# Patient Record
Sex: Male | Born: 1975 | Race: Black or African American | Hispanic: No | Marital: Married | Smoking: Current every day smoker
Health system: Southern US, Community
[De-identification: ages and names within clinical notes are randomized; demographics above are authoritative.]

## PROBLEM LIST (undated history)

## (undated) DIAGNOSIS — F32A Depression, unspecified: Secondary | ICD-10-CM

## (undated) DIAGNOSIS — F419 Anxiety disorder, unspecified: Secondary | ICD-10-CM

## (undated) DIAGNOSIS — F209 Schizophrenia, unspecified: Secondary | ICD-10-CM

## (undated) DIAGNOSIS — F4310 Post-traumatic stress disorder, unspecified: Secondary | ICD-10-CM

---

## 2011-11-18 ENCOUNTER — Emergency Department: Payer: Self-pay | Admitting: *Deleted

## 2020-07-15 ENCOUNTER — Ambulatory Visit: Payer: Self-pay | Attending: Internal Medicine

## 2020-07-15 DIAGNOSIS — Z23 Encounter for immunization: Secondary | ICD-10-CM

## 2020-07-15 NOTE — Progress Notes (Signed)
   Covid-19 Vaccination Clinic  Name:  TYWON NIDAY    MRN: [id number] DOB: 1976/11/20  07/15/2020  Mr. Brew was observed post Covid-19 immunization for 15 minutes without incident. He was provided with Vaccine Information Sheet and instruction to access the V-Safe system.   Mr. Daubenspeck was instructed to call 911 with any severe reactions post vaccine: Marland Kitchen Difficulty breathing  . Swelling of face and throat  . A fast heartbeat  . A bad rash all over body  . Dizziness and weakness   Immunizations Administered    Name Date Dose VIS Date Route   Pfizer COVID-19 Vaccine 07/15/2020  4:47 PM 0.3 mL 01/23/2019 Intramuscular   Manufacturer: [employer], [employer]   Lot: [id number]   NDC: [address]-3

## 2020-08-05 ENCOUNTER — Ambulatory Visit: Payer: Self-pay

## 2020-08-25 ENCOUNTER — Other Ambulatory Visit: Payer: Self-pay

## 2020-08-25 ENCOUNTER — Ambulatory Visit (HOSPITAL_COMMUNITY)
Admission: EM | Admit: 2020-08-25 | Discharge: 2020-08-25 | Disposition: A | Discharge status: Home / Self Care | Payer: Self-pay | Attending: Family Medicine | Admitting: Family Medicine

## 2020-08-25 ENCOUNTER — Ambulatory Visit (INDEPENDENT_AMBULATORY_CARE_PROVIDER_SITE_OTHER): Payer: Self-pay

## 2020-08-25 ENCOUNTER — Encounter (HOSPITAL_COMMUNITY): Payer: Self-pay | Admitting: Emergency Medicine

## 2020-08-25 DIAGNOSIS — R0602 Shortness of breath: Secondary | ICD-10-CM

## 2020-08-25 DIAGNOSIS — S20212A Contusion of left front wall of thorax, initial encounter: Secondary | ICD-10-CM

## 2020-08-25 DIAGNOSIS — R05 Cough: Secondary | ICD-10-CM

## 2020-08-25 DIAGNOSIS — R0781 Pleurodynia: Secondary | ICD-10-CM

## 2020-08-25 MED ORDER — HYDROCODONE-ACETAMINOPHEN 5-325 MG PO TABS: 1.0000 | ORAL_TABLET | Freq: Every evening | ORAL | 0 refills | Status: DC | PRN

## 2020-08-25 MED ORDER — IBUPROFEN 800 MG PO TABS: 800.0000 mg | ORAL_TABLET | Freq: Three times a day (TID) | ORAL | 0 refills | Status: DC

## 2020-08-25 NOTE — Discharge Instructions (Addendum)
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
## 2021-02-07 ENCOUNTER — Other Ambulatory Visit: Payer: Self-pay

## 2021-02-07 ENCOUNTER — Emergency Department (HOSPITAL_COMMUNITY): Payer: Medicaid Other

## 2021-02-07 ENCOUNTER — Emergency Department (HOSPITAL_COMMUNITY)
Admission: EM | Admit: 2021-02-07 | Discharge: 2021-02-13 | Disposition: A | Discharge status: Home / Self Care | Payer: Medicaid Other | Attending: Emergency Medicine | Admitting: Emergency Medicine

## 2021-02-07 ENCOUNTER — Encounter (HOSPITAL_COMMUNITY): Payer: Self-pay | Admitting: Emergency Medicine

## 2021-02-07 ENCOUNTER — Ambulatory Visit (HOSPITAL_COMMUNITY)
Admission: EM | Admit: 2021-02-07 | Discharge: 2021-02-07 | Disposition: A | Discharge status: Other Acute Inpatient Hospital | Payer: No Payment, Other | Attending: Psychiatry | Admitting: Psychiatry

## 2021-02-07 DIAGNOSIS — Z20822 Contact with and (suspected) exposure to covid-19: Secondary | ICD-10-CM | POA: Insufficient documentation

## 2021-02-07 DIAGNOSIS — F339 Major depressive disorder, recurrent, unspecified: Secondary | ICD-10-CM | POA: Insufficient documentation

## 2021-02-07 DIAGNOSIS — F1999 Other psychoactive substance use, unspecified with unspecified psychoactive substance-induced disorder: Secondary | ICD-10-CM

## 2021-02-07 DIAGNOSIS — R45850 Homicidal ideations: Secondary | ICD-10-CM

## 2021-02-07 DIAGNOSIS — F1990 Other psychoactive substance use, unspecified, uncomplicated: Secondary | ICD-10-CM | POA: Insufficient documentation

## 2021-02-07 DIAGNOSIS — F209 Schizophrenia, unspecified: Secondary | ICD-10-CM | POA: Diagnosis present

## 2021-02-07 DIAGNOSIS — F17200 Nicotine dependence, unspecified, uncomplicated: Secondary | ICD-10-CM | POA: Insufficient documentation

## 2021-02-07 DIAGNOSIS — R45851 Suicidal ideations: Secondary | ICD-10-CM | POA: Insufficient documentation

## 2021-02-07 HISTORY — DX: Schizophrenia, unspecified: F20.9

## 2021-02-07 LAB — URINALYSIS, ROUTINE W REFLEX MICROSCOPIC
Bilirubin Urine: NEGATIVE
Glucose, UA: NEGATIVE mg/dL
Hgb urine dipstick: NEGATIVE
Ketones, ur: NEGATIVE mg/dL
Leukocytes,Ua: NEGATIVE
Nitrite: NEGATIVE
Protein, ur: NEGATIVE mg/dL
Specific Gravity, Urine: 1.025 (ref 1.005–1.030)
pH: 5 (ref 5.0–8.0)

## 2021-02-07 LAB — COMPREHENSIVE METABOLIC PANEL
ALT: 24 U/L (ref 0–44)
AST: 22 U/L (ref 15–41)
Albumin: 3.7 g/dL (ref 3.5–5.0)
Alkaline Phosphatase: 64 U/L (ref 38–126)
Anion gap: 8 (ref 5–15)
BUN: 8 mg/dL (ref 6–20)
CO2: 28 mmol/L (ref 22–32)
Calcium: 8.7 mg/dL — ABNORMAL LOW (ref 8.9–10.3)
Chloride: 103 mmol/L (ref 98–111)
Creatinine, Ser: 0.97 mg/dL (ref 0.61–1.24)
GFR, Estimated: >60 mL/min (ref >60)
Glucose, Bld: 87 mg/dL (ref 70–99)
Potassium: 3.7 mmol/L (ref 3.5–5.1)
Sodium: 139 mmol/L (ref 135–145)
Total Bilirubin: 0.9 mg/dL (ref 0.3–1.2)
Total Protein: 6.6 g/dL (ref 6.5–8.1)

## 2021-02-07 LAB — RAPID URINE DRUG SCREEN, HOSP PERFORMED
Amphetamines: NOT DETECTED
Barbiturates: NOT DETECTED
Benzodiazepines: POSITIVE — AB
Cocaine: NOT DETECTED
Opiates: NOT DETECTED
Tetrahydrocannabinol: POSITIVE — AB

## 2021-02-07 LAB — RESP PANEL BY RT-PCR (FLU A&B, COVID) ARPGX2
Influenza A by PCR: NEGATIVE
Influenza B by PCR: NEGATIVE
SARS Coronavirus 2 by RT PCR: NEGATIVE

## 2021-02-07 LAB — CBC
HCT: 47.4 % (ref 39.0–52.0)
Hemoglobin: 16.3 g/dL (ref 13.0–17.0)
MCH: 33.7 pg (ref 26.0–34.0)
MCHC: 34.4 g/dL (ref 30.0–36.0)
MCV: 97.9 fL (ref 80.0–100.0)
Platelets: 228 10*3/uL (ref 150–400)
RBC: 4.84 MIL/uL (ref 4.22–5.81)
RDW: 13.2 % (ref 11.5–15.5)
WBC: 18.3 10*3/uL — ABNORMAL HIGH (ref 4.0–10.5)
nRBC: 0 % (ref 0.0–0.2)

## 2021-02-07 LAB — SALICYLATE LEVEL: Salicylate Lvl: <7 mg/dL — ABNORMAL LOW (ref 7.0–30.0)

## 2021-02-07 LAB — ACETAMINOPHEN LEVEL: Acetaminophen (Tylenol), Serum: <10 ug/mL — ABNORMAL LOW (ref 10–30)

## 2021-02-07 LAB — ETHANOL: Alcohol, Ethyl (B): <10 mg/dL (ref <10)

## 2021-02-07 NOTE — ED Notes (Signed)
Belongings bagged, labeled, and placed in locker #5.  Security wanded pt and belongings.
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
## 2021-02-07 NOTE — BH Assessment (Signed)
Comprehensive Clinical Assessment (CCA) Note  02/07/2021 Justin Neal [id number]  Chief Complaint:  Chief Complaint  Patient presents with  . Medical Clearance   Visit Diagnosis:   F33.9 Major depressive disorder, Recurrent episode, Unspecified   The patient demonstrates the following risk factors for suicide: Chronic risk factors for suicide include: psychiatric disorder of major depressive disorder, no suicide or previous attempts today and 45 years old, pt reports HI towards previous relationships, current substance used . Acute risk factors for suicide include: family or marital conflict, unemployment and social withdrawal/isolation. Protective factors for this patient include: positive social support, positive therapeutic relationship and coping skills. Considering these factors, the overall suicide risk at this point appears to be.  appropriate for outpatient follow up.    Flowsheet Row ED from 02/07/2021 in [facility] EMERGENCY DEPARTMENT  C-SSRS RISK CATEGORY No Risk     Therefore, no sitter for suicide is recommended.  Disposition: [name] NP, patient meets inpatient criteria,  morning AC to evaluate bed availability.  Disposition was discussed with [name], RN via secure chat in [place].  RN to discuss disposition with EDP.   Justin Neal is a 45 years old single male who presents involuntarily to [employer] Ed via Patent examiner and unaccompanied.  Pt sister Justin Neal [phone] , participated in his assessment at Pt's request. Pt IVC reads " Pt presents to the ED from [facility] with homicidal ideation.  He has a history of schizophrenia, not on medications currently and also has assault charges against him.  Pt found to be impulsive with volatile behavior at [facility] today.  He is currently a threat to other people and flight risk. Pt denies suicide and prior suicide attempt.  Pt acknowledges symptoms including crying, social withdrawal, angry,  argumentative, blames others, irritable, and easily annoyed. Pt reports decreased sleep, decreased appetite  And feeling hopelessness. Pt reports "they are following me, hacking my phone and harassing me, I am going to get them".  Pt sister  Justin Neal reports "my brother thinks the government and police are following him; also he refused to take his medicaton, he said it made him sick and zonk out".    Pt denies any history of intentional self injurious behaviors.  Pt denies any history of auditory or visual hallucination.  Pt denies paranoia.  Pt reports that he smokes marijuana; also reported that he was prescribed medically marijuana in [place].  Pt denies any other substance use.  Pt identifies his primary stressor as being homeless and unemployed "people trying to take me under".  Pt reports that he lives in his car and various spots in the community.  Pt reports that previous relationships are trying to set him up to return back to jail. Pt reports both paternal and maternal family history deal with mental health and subtance use.  Pt denies any history of abuse or trauma.  Pt reports a court date scheduled for February 13, 2021.    Pt says he is currently not receiving weekly outpatient therapy; also reports that he is not taking medication management.  Pt reports that he was prescribed trazodone and risperidone by [facility], currently is not taking it.  Pt reports no prior inpatient psychiatric hospitalization.  Pt reports that he has a diagnosis of injury collarbone.    Pt is dressed in scrubs, alert, oriented X 4 with loud speech and restless motor behavior.  Eye contact is good and pt mood negative and affect is anxious.  Thought process is coherent and relevant.  Pt' s insight is good and judgement is fair.  There is no indication Pt is currently responding to internal stimuli throughout assessment.     CCA Screening, Triage and Referral (STR)  Patient Reported Information How did you hear  about [place]? Self  Referral name: No data recorded Referral phone number: No data recorded  Whom do you see for routine medical problems? No data recorded Practice/Facility Name: No data recorded Practice/Facility Phone Number: No data recorded Name of Contact: No data recorded Contact Number: No data recorded Contact Fax Number: No data recorded Prescriber Name: No data recorded Prescriber Address (if known): No data recorded  What Is the Reason for Your Visit/Call Today? Patient states that he is off his medication and feels like he is decompensating.  He is hyperverbal and somewhat manic.  Homicidal thoughts towards others that he thinks is messing with him.  How Long Has This Been Causing You Problems? > than 6 months  What Do You Feel Would Help You the Most Today? Treatment for Depression or other mood problem; Alcohol or Drug Use Treatment; Housing Assistance; Financial Resources; Food Assistance; Social Support; Tobacco/Nicotine Dependence; Medication(s)   Have You Recently Been in Any Inpatient Treatment (Hospital/Detox/Crisis Center/28-Day Program)? No data recorded Name/Location of Program/Hospital:No data recorded How Long Were You There? No data recorded When Were You Discharged? No data recorded  Have You Ever Received Services From [facility] Before? No data recorded Who Do You See at [facility]? No data recorded  Have You Recently Had Any Thoughts About Hurting Yourself? No  Are You Planning to Commit Suicide/Harm Yourself At This time? No   Have you Recently Had Thoughts About Hurting Someone Justin Neal? Yes  Explanation: No data recorded  Have You Used Any Alcohol or Drugs in the Past 24 Hours? Yes  How Long Ago Did You Use Drugs or Alcohol? No data recorded What Did You Use and How Much? unable to assess   Do You Currently Have a Therapist/Psychiatrist? No data recorded Name of Therapist/Psychiatrist: No data recorded  Have You Been Recently Discharged From  Any Office Practice or Programs? No data recorded Explanation of Discharge From Practice/Program: No data recorded    CCA Screening Triage Referral Assessment Type of Contact: No data recorded Is this Initial or Reassessment? No data recorded Date Telepsych consult ordered in [facility]:  No data recorded Time Telepsych consult ordered in [facility]:  No data recorded  Patient Reported Information Reviewed? No data recorded Patient Left Without Being Seen? No data recorded Reason for Not Completing Assessment: No data recorded  Collateral Involvement: No data recorded  Does Patient Have a Court Appointed Legal Guardian? No data recorded Name and Contact of Legal Guardian: No data recorded If Minor and Not Living with Parent(s), Who has Custody? No data recorded Is CPS involved or ever been involved? No data recorded Is APS involved or ever been involved? No data recorded  Patient Determined To Be At Risk for Harm To Self or Others Based on Review of Patient Reported Information or Presenting Complaint? No data recorded Method: No data recorded Availability of Means: No data recorded Intent: No data recorded Notification Required: No data recorded Additional Information for Danger to Others Potential: No data recorded Additional Comments for Danger to Others Potential: No data recorded Are There Guns or Other Weapons in Your Home? No data recorded Types of Guns/Weapons: No data recorded Are These Weapons Safely Secured?  No data recorded Who Could Verify You Are Able To Have These Secured: No data recorded Do You Have any Outstanding Charges, Pending Court Dates, Parole/Probation? No data recorded Contacted To Inform of Risk of Harm To Self or Others: No data recorded  Location of Assessment: No data recorded  Does Patient Present under Involuntary Commitment? No data recorded IVC Papers Initial File Date: No data recorded  [place] of Residence: No data  recorded  Patient Currently Receiving the Following Services: No data recorded  Determination of Need: Emergent (2 hours)   Options For Referral: Medication Management; Intensive Outpatient Therapy; Inpatient Hospitalization; Partial Hospitalization; Other: Comment (ED for medical clearance)     CCA Biopsychosocial Intake/Chief Complaint:  Paranoia  Current Symptoms/Problems: aggressive, angry, impulsive, irritable   Patient Reported Schizophrenia/Schizoaffective Diagnosis in Past: Yes   Strengths: [name]  Preferences: [name]  Abilities: [name]   Type of Services Patient Feels are Needed: [name]   Initial Clinical Notes/Concerns: Impulsive   Mental Health Symptoms Depression:  Difficulty Concentrating; Fatigue; Hopelessness; Irritability; Tearfulness; Worthlessness   Duration of Depressive symptoms: Greater than two weeks   Mania:  Irritability; Racing thoughts; Recklessness   Anxiety:   Restlessness; Irritability; Fatigue; Difficulty concentrating; Worrying; Tension   Psychosis:  None   Duration of Psychotic symptoms: Less than six months   Trauma:  None   Obsessions:  None   Compulsions:  Disrupts with routine/functioning   Inattention:  None   Hyperactivity/Impulsivity:  Always on the go; Blurts out answers; Feeling of restlessness; Fidgets with hands/feet   Oppositional/Defiant Behaviors:  Angry; Argumentative; Easily annoyed; Aggression towards people/animals   Emotional Irregularity:  No data recorded  Other Mood/Personality Symptoms:  easily annoyed    Mental Status Exam Appearance and self-care  Stature:  Average   Weight:  Average weight   Clothing:  -- (Pt dressed in scrubs)   Grooming:  Normal   Cosmetic use:  None   Posture/gait:  Normal   Motor activity:  Agitated   Sensorium  Attention:  No data recorded  Concentration:  Scattered   Orientation:  Object; Person; Situation; Place   Recall/memory:  Normal   Affect and Mood   Affect:  Anxious   Mood:  Irritable; Negative   Relating  Eye contact:  Normal   Facial expression:  Tense; Angry   Attitude toward examiner:  Cooperative   Thought and Language  Speech flow: Loud; Pressured   Thought content:  Personalizations   Preoccupation:  None   Hallucinations:  None   Organization:  No data recorded  [employer] of Knowledge:  Fair   Intelligence:  Below average   Abstraction:  Overly abstract   Judgement:  Fair   Reality Testing:  Distorted   Insight:  Poor; Gaps   Decision Making:  Impulsive   Social Functioning  Social Maturity:  Impulsive   Social Judgement:  "Street Smart"   Stress  Stressors:  Housing; Relationship; Transitions   Coping Ability:  Human resources officer Deficits:  Decision making; Responsibility; Self-care; Self-control   Supports:  Family; Support needed     Religion: Religion/Spirituality Are You A Religious Person?: Yes What is Your Religious Affiliation?: Primitive Baptist How Might This Affect Treatment?: support  Leisure/Recreation: Leisure / Recreation Do You Have Hobbies?:  ([name])  Exercise/Diet: Exercise/Diet Do You Exercise?: Yes What Type of Exercise Do You Do?: Run/Walk How Many Times a Week Do You Exercise?: 1-3 times a week Have You Gained or Lost A Significant Amount  of Weight in the Past Six Months?: No Do You Follow a Special Diet?: No Do You Have Any Trouble Sleeping?: Yes Explanation of Sleeping Difficulties: Pt reports that he is homeless, unable to locate a safe place to sleep at night   CCA Employment/Education Employment/Work Situation: Employment / Work Situation Employment situation: Unemployed Patient's job has been impacted by current illness: Yes Describe how patient's job has been impacted: Pt reports he unable to find work, only day work What is the longest time patient has a held a job?: [name] Where was the patient employed at that time?: [name] Has  patient ever been in the [employer] and Companymilitary?: No  Education: Education Is Patient Currently Attending School?: No Last Grade Completed:  (Pt reported that he did not graduate from high school, received a GED) Name of High School: Pt reported that he did not graduate from high school, received a GED Did Garment/textile technologistYou Graduate From [employer]High School?: No Did Theme park managerYou Attend College?: No Did You Attend Graduate School?: No Did You Have Any Special Interests In School?: [name] Did You Have An Individualized Education Program (IIEP):  ([name]) Did You Have Any Difficulty At Progress EnergySchool?:  ([name]) Patient's Education Has Been Impacted by Current Illness:  ([name])   CCA Family/Childhood History Family and Relationship History: Family history Are you sexually active?:  ([name]) What is your sexual orientation?: [name] Has your sexual activity been affected by drugs, alcohol, medication, or emotional stress?: [name] Does patient have children?: Yes How many children?: 1 How is patient's relationship with their children?: [name]  Childhood History:     Child/Adolescent Assessment:     CCA Substance Use Alcohol/Drug Use: Alcohol / Drug Use Pain Medications: See MRA Prescriptions: See MRA Over the Counter: See MRA History of alcohol / drug use?: Yes Substance #1 Name of Substance 1: Marijuana 1 - Age of First Use: 12 1 - Amount (size/oz): [name] 1 - Frequency: [name] 1 - Duration: [name] 1 - Last Use / Amount: [name] 1 - Method of Aquiring: [name] 1- Route of Use: [name] Substance #2 Name of Substance 2: Cigerettes 2 - Age of First Use: [name] 2 - Amount (size/oz): [name] 2 - Frequency: [name] 2 - Duration: [name] 2 - Last Use / Amount: [name] 2 - Method of Aquiring: [name] 2 - Route of Substance Use: [name]                     ASAM's:  Six Dimensions of Multidimensional Assessment  Dimension 1:  Acute Intoxication and/or Withdrawal Potential:   Dimension 1:  Description of individual's past and current experiences of substance use and withdrawal: Pt  reports that he smokes marijuana for anxiety; also, reported that it was medically prescribed by physican in [place]  Dimension 2:  Biomedical Conditions and Complications:   Dimension 2:  Description of patient's biomedical conditions and  complications: Pt reports prior injuries to collarbone.  Dimension 3:  Emotional, Behavioral, or Cognitive Conditions and Complications:  Dimension 3:  Description of emotional, behavioral, or cognitive conditions and complications: Pt reports depression, anxiety, sschizophrenic, PTSD  Dimension 4:  Readiness to Change:  Dimension 4:  Description of Readiness to Change criteria: Pt reports that he is tired of being homeless  Dimension 5:  Relapse, Continued use, or Continued Problem Potential:  Dimension 5:  Relapse, continued use, or continued problem potential critiera description: contemplation  Dimension 6:  Recovery/Living Environment:  Dimension 6:  Recovery/Iiving environment criteria description: homeless  ASAM Severity Score: ASAM's Severity Rating  Score: 14  ASAM Recommended Level of Treatment:     Substance use Disorder (SUD) Substance Use Disorder (SUD)  Checklist Symptoms of Substance Use: Continued use despite having a persistent/recurrent physical/psychological problem caused/exacerbated by use,Continued use despite persistent or recurrent social, interpersonal problems, caused or exacerbated by use  Recommendations for Services/Supports/Treatments: Recommendations for Services/Supports/Treatments Recommendations For Services/Supports/Treatments: SAIOP (Substance Abuse Intensive Outpatient Program)  DSM5 Diagnoses: There are no problems to display for this patient.     Referrals to Alternative Service(s): Referred to Alternative Service(s):   Place:   Date:   Time:    Referred to Alternative Service(s):   Place:   Date:   Time:    Referred to Alternative Service(s):   Place:   Date:   Time:    Referred to Alternative Service(s):   Place:    Date:   Time:     Meryle Ready, Counselor

## 2021-02-07 NOTE — Progress Notes (Signed)
Patient presented to the [facility] as a walk-in. His speech was pressured, he was tangential, hyper-verbal and appeared to be potentially labile.  Patient states that he moved back to [place] from [place] approximately one year ago.  He states that he has been diagnosed with schizophrenia, bipolar disorder, depression and schizophrenia and states that he has not been on his medication in the past year. Patient states that he has been in and out of prison and states that he was involved with a male that he thinks is trying to get him back in the system.  Patient presently as assault charges, assault with a deadly weapon as well as some traffic infractions and is scheduled to be in court on 4/15.  Patient denies SI, but states that if he continues to be provoked that he is angry enough that he could kill the people who are provoking him and trying to get him into trouble. He denies any previous attempts to try to hurt himself. He states that he has no plan or intent to hurt anyone at this time.Patient admits to daily marijuana use and states that is the only thing that has helped him to maintain his composure.  Patient states that he is currently homeless and has no support.  Patient states, "I cannot continue to live this way."  Patient states that he des not know where to go to get the help that he needs and feels like he needs to be admitted to the hospital, get back on his medications and to get some assistance with community based resources that he can utilize to continue to maintain his life and not have to be on the streets.  However, if he is convicted of his current charges, he will most likely be incarcerated.  Patient states that he has not been sleeping or eating well. Patient was seen by Maxie Barb, NP who felt like patient needs an inpatient admission due to his impulsivity and volatile behavior and the fact that he is communicating homicidal thoughts. Arrangements were made to transfer patient to  [facility].  This Clinical research associate spoke to [employer], Consulting civil engineer to notify of the transfer.  Non-emergent EMS to transport.

## 2021-02-07 NOTE — ED Provider Notes (Signed)
Behavioral Health Admission H&P [facility] & OBS)  Date: 02/07/21 Patient Name: Justin Neal MRN: [id number] Chief Complaint:  Chief Complaint  Patient presents with  . Urgent Emergent Eval     Diagnoses:  Final diagnoses:  Substance-induced disorder (HCC)   HPI: Justin Neal is a 45 year old male who presents to the [facility] voluntarily for assessment; patient states he "needs to get back in the system for some help".   On assessment patient presents labile, hyperverbal, tangential, illogical, and paranoid. Patient mentioning people "after him" and possibly having to "kill somebody". While provider was asking questions patient noted to increase in aggression and making several threats. Patient endorses to smoking marijuana "this morning"; denies that his current presentation is as a result. Patient admits to an extensive legal history and psychiatric history of anxiety, depression, bipolar, and schizophrenia. Patient states he was recently in jail where he "snapped because they did not give me my medications". Patient does have several pending charges.  Due to escalating aggressive presentation and level of paranoia patient to be transferred to local ED for further observation, stabilization, and treatment to determine if presentation is substance induce or related to underlying psychiatric diagnosis. Patient is currently declining any collateral contact information. He has agreed to plan and is currently awaiting transportation.   PHQ 2-9:     Total Time spent with patient: 15 minutes  Musculoskeletal  Strength & Muscle Tone: increased Gait & Station: normal Patient leans: N/A  Psychiatric Specialty Exam  Presentation General Appearance: Disheveled  Eye Contact:Fair  Speech:Clear and Coherent; Pressured  Speech Volume:Normal  Handedness:No data recorded  Mood and Affect  Mood:Irritable; Dysphoric  Affect:Blunt   Thought Process  Thought Processes:Disorganized  Descriptions  of Associations:Tangential  Orientation:Full (Time, Place and Person)  Thought Content:Paranoid Ideation; Tangential   Hallucinations:Hallucinations: Auditory Description of Auditory Hallucinations: "people after me"  Ideas of Reference:Paranoia; Percusatory  Suicidal Thoughts:Suicidal Thoughts: No  Homicidal Thoughts:Homicidal Thoughts: Yes, Active HI Active Intent and/or Plan: With Intent; Without Plan   Sensorium  Memory:Immediate Fair; Recent Fair; Remote Fair  Judgment:Impaired (possibly secondary to substance; pt endorses smoking weed earlier in day)  Insight:Lacking   Executive Functions  Concentration:Fair  Attention Span:Fair  Recall:Fair  Fund of Knowledge:Fair  Language:Fair   Psychomotor Activity  Psychomotor Activity:Psychomotor Activity: Increased   Assets  Assets:Communication Skills; Physical Health; Resilience   Sleep  Sleep:Sleep: Fair   No data recorded  Physical Exam Vitals and nursing note reviewed.  Psychiatric:        Attention and Perception: He is inattentive.        Mood and Affect: Affect is labile and blunt.        Speech: Speech is rapid and pressured and tangential.        Behavior: Behavior is agitated and aggressive.        Thought Content: Thought content is paranoid. Thought content includes homicidal ideation.        Judgment: Judgment is impulsive and inappropriate.    Review of Systems  Psychiatric/Behavioral: Positive for substance abuse.  All other systems reviewed and are negative.   Blood pressure 120/79, pulse 62, temperature 98.6 F (37 C), temperature source Oral, resp. rate 16, height 5\' 11"  (1.803 m), weight 210 lb (95.3 kg), SpO2 100 %. Body mass index is 29.29 kg/m.  Past Psychiatric History:  Patient reports:   -anxiety  -depression  -bipolar  -schizophrenia   Is the patient at risk to self? unable to fully determine  Has the patient been a risk to self in the past 6 months? patient denies.     Has the patient been a risk to self within the distant past? patient denies  Is the patient a risk to others? Yes   Has the patient been a risk to others in the past 6 months? Yes   Has the patient been a risk to others within the distant past? Yes   Past Medical History: No past medical history on file. No past surgical history on file.  Family History: No family history on file.  Social History:  Social History   Socioeconomic History  . Marital status: Married    Spouse name: Not on file  . Number of children: Not on file  . Years of education: Not on file  . Highest education level: Not on file  Occupational History  . Not on file  Tobacco Use  . Smoking status: Current Every Day Smoker  . Smokeless tobacco: Not on file  Substance and Sexual Activity  . Alcohol use: Not on file  . Drug use: Not on file  . Sexual activity: Not on file  Other Topics Concern  . Not on file  Social History Narrative  . Not on file   Social Determinants of Health   Financial Resource Strain: Not on file  Food Insecurity: Not on file  Transportation Needs: Not on file  Physical Activity: Not on file  Stress: Not on file  Social Connections: Not on file  Intimate Partner Violence: Not on file    SDOH:  SDOH Screenings   Alcohol Screen: Not on file  Depression (FMB3-4): Not on file  Financial Resource Strain: Not on file  Food Insecurity: Not on file  Housing: Not on file  Physical Activity: Not on file  Social Connections: Not on file  Stress: Not on file  Tobacco Use: High Risk  . Smoking Tobacco Use: Current Every Day Smoker  . Smokeless Tobacco Use: Unknown  Transportation Needs: Not on file    Last Labs:  No visits with results within 6 Month(s) from this visit.  Latest known visit with results is:  No results found for any previous visit.    Allergies: Patient has no allergy information on record.  PTA Medications: (Not in a hospital admission)   Medical  Decision Making  Patient to transfer to local ED for further observation, stabilization, and treatment.     Recommendations  Patient being transferred to ED for further observation, safety, and stabilization to rule out any possible underlying causes.   Loletta Parish, NP 02/07/21  2:32 PM

## 2021-02-08 MED ORDER — GABAPENTIN 100 MG PO CAPS
200.0000 mg | ORAL_CAPSULE | Freq: Two times a day (BID) | ORAL | Status: DC
  Administered 2021-02-08 – 2021-02-13 (×10): 200 mg via ORAL
  Filled 2021-02-08 (×11): qty 2

## 2021-02-08 MED ORDER — RISPERIDONE 1 MG PO TABS
1.0000 mg | ORAL_TABLET | Freq: Two times a day (BID) | ORAL | Status: DC
  Administered 2021-02-08: 1 mg via ORAL
  Filled 2021-02-08 (×7): qty 1

## 2021-02-08 MED ORDER — TRAZODONE HCL 50 MG PO TABS
50.0000 mg | ORAL_TABLET | Freq: Every day | ORAL | Status: DC
  Filled 2021-02-08 (×4): qty 1

## 2021-02-08 NOTE — ED Notes (Signed)
Pt given toiletries and new scrubs/socks for a shower. Pt was not offered a shower in the morning

## 2021-02-08 NOTE — Progress Notes (Signed)
Per [name] NP, patient meets criteria for inpatient treatment. There are no available beds at [facility]. CSW faxed referrals to the following facilities for review:  [facility]  TTS will continue to seek bed placement.   Trula Slade, MSW, LCSW Clinical Social Worker 02/08/2021 10:52 AM

## 2021-02-08 NOTE — ED Notes (Signed)
Pt psych hold, vitals q8.

## 2021-02-08 NOTE — ED Provider Notes (Signed)
Emergency Medicine Observation Re-evaluation Note  Justin Neal is a 45 y.o. male, seen on rounds today.  Pt initially presented to the ED for complaints of Medical Clearance Currently, the patient is waiting in the ED for an inpatient psychiatric treatment bed.  Physical Exam  BP (!) 135/93 (BP Location: Left Arm)   Pulse (!) 51   Temp 98.2 F (36.8 C)   Resp 17   Ht 1.803 m (5\' 11" )   Wt 95.3 kg   SpO2 96%   BMI 29.30 kg/m  Physical Exam General: Calm cooperative Cardiac: Regular rate Lungs: Breathing easily Psych: Depressed mood  ED Course / MDM  EKG:  Clinical Course as of 02/08/21 1351  Sat Feb 07, 2021  1901 Pt is medically cleared. Pending TTS eval [MV]    Clinical Course User Index [MV] Feb 09, 2021, PA-C   I have reviewed the labs performed to date as well as medications administered while in observation.  Recent changes in the last 24 hours include assessment by psychiatry.  Plan  Current plan is for inpatient treatment.    Tanda Rockers, MD 02/08/21 1352

## 2021-02-08 NOTE — ED Notes (Signed)
Explained the plan of care to patient. Patient understanding. Patient endorses no needs at this time.

## 2021-02-09 NOTE — ED Notes (Signed)
Pt asked to talk to this tech. Pt stated they felt uncomfortable by the way that the pharmacy tech spoke to him. Stating "Was he trying to prescribe me new meds? Or what? I don't want the wrong meds, it felt like he was pressuring me to tell him my medicine so he can prescribe me the wrong meds".  Reassured pt that the pharmacy tech was not doing so, but simply needed to update his medication list. Pt then visibly became calm. He then thanked for the misunderstanding. Will continue to monitor pt.

## 2021-02-09 NOTE — ED Notes (Signed)
Patient unsatisfied with meal tray, kitchen called, to send replacement tray w/o red sauce.

## 2021-02-09 NOTE — Progress Notes (Signed)
Patient information has been sent to [facility] via secure chat to review for potential admission. Patient meets inpatient criteria per [name], NP.     Situation ongoing, CSW will continue to monitor progress.        Signed:  Wyvonnia Lora, LCSWA  02/09/2021 9:58 AM

## 2021-02-09 NOTE — ED Notes (Signed)
Pt.s breakfast has arrived. Pt sitting up and eating his breakfast. Will continue to monitor pt.  

## 2021-02-09 NOTE — Progress Notes (Signed)
Per [facility], facility at capacity due to staffing issues. CSW referred patient out at this time.   Pt. meets criteria for inpatient treatment per [name], NP. Referred out to the following hospitals:    Destination  Service Provider Address Phone [facility]  [address] [place] [place] [address] [phone]  [facility]  [address] [place] [address] [phone]  [facility]  1 medical [place] [address] [phone]  [facility]  [address], [place] [address] [phone]  [facility]  [address] [place], [place] [address] [phone]  [facility]  [address] [place] [address] [phone]  [facility]  [address], [place] [address] [phone]  [facility]  [address], [place] [address] [phone]  [facility]  [address] [place] [address] [phone]  [facility]  [address] [place] [address] [phone]  [facility]  [address] [place] [address] [phone]  [facility]  [address], [place] [address] [phone]  [facility]  [address], [place] [address] [phone]  [facility]  [address] [place] [address] [phone]      Disposition CSW will continue to follow for placement    Signed:  Corky Crafts, MSW, [place], LCASA 02/09/2021 2:08 PM

## 2021-02-10 DIAGNOSIS — R45850 Homicidal ideations: Secondary | ICD-10-CM

## 2021-02-10 DIAGNOSIS — F209 Schizophrenia, unspecified: Secondary | ICD-10-CM | POA: Diagnosis present

## 2021-02-10 MED ORDER — OLANZAPINE 5 MG PO TBDP
5.0000 mg | ORAL_TABLET | Freq: Every day | ORAL | Status: DC
  Administered 2021-02-10 – 2021-02-13 (×4): 5 mg via ORAL
  Filled 2021-02-10 (×4): qty 1

## 2021-02-10 NOTE — ED Notes (Signed)
Pt is becoming very upset that he was going to be moved to a different room. Pt is cussing at staff because he not wanting to keep moving rooms every single day. He wants to stay in a room where he can have "peace".

## 2021-02-10 NOTE — Consult Note (Signed)
Telepsych Consultation   Reason for Consult:  HI, IVC Referring Physician:  Tanda Rockers, MD Location of Patient:  [facility] [phone] Location of Provider: Behavioral Health TTS Department  Patient Identification: Justin Neal MRN:  [id number] Principal Diagnosis: Homicidal ideations Diagnosis:  Principal Problem:   Homicidal ideations Active Problems:   Schizophrenia (HCC)   Total Time spent with patient: 30 minutes  Subjective:   Justin Neal is a 45 y.o. male patient admitted with homicidal ideations and paranoia.  On assessment patient presents alert and oriented, initially calm and cooperative.  "It's a combination of a few things. Dealing with issues within society. Not getting my medications because of press and pressure. I'm doing my best to stay away from everybody. That's why I came in here, I'm being stalked and harassed by different people in society. They keep coming after me and people are laughing at me because it's driving me crazy. Some people are street people some people are working with the police. I do nothing, I don't sell nothing so I became a target. They keep sending me texts, they keep trying to extort people they apply pressure from both sides. No body wants to admit it. I'm tired of playing with them".   Patient continued to speak on tangentially about paranoid ideas of people being after him and receiving messages from radios and via text of threats and people "laughing at him". Patient voices having intent and means to kill "him", wouldn't provide any specifics stating the person sent him text messages during the morning making threats. Patient refused a.m. Risperdal. When asked about it patient became tangential stating, "It makes you grow titties. You don't just put someone on medications and not thinking about the consequences or side effects". Provider discussed making medication adjustment and mentioned Zyprexa as an alternative, patient states he is willing to try  medication, states "I want to get this problem fixed".   Patient continues to endorse paranoia, ideas of reference, and homicidal ideations. He denies any suicidal ideations, auditory or visual hallucinations, and does not appear to be responding to any external/internal stimuli at this time. Patient medications adjusted: Risperdal dc'd and started on 5 mg Zyprexa. Patient continues to meet inpatient criteria at this time.   HPI:   Justin Neal is a 45 year old male who initially presented to [facility] for assessment "to get back in the system", due to patient's acute presentation he was then transferred to [facility] for further observation, stabilization, and treatment. Patient reports past psychiatric history of schizophrenia; not currently on any medications. Last time in any outpatient treatment not clear due to patient's current state. Per chart review, patient was seen by [facility] inpatient in 2011; no diagnosis noted.   Past Psychiatric History:   -schizophrenia (patient reported)  Risk to Self:  pt denies Risk to Others:  yes Prior Inpatient Therapy:  yes Prior Outpatient Therapy:  yes  Past Medical History:  Past Medical History:  Diagnosis Date  . Schizophrenia (HCC)    History reviewed. No pertinent surgical history. Family History: No family history on file. Family Psychiatric  History:  Social History:  Social History   Substance and Sexual Activity  Alcohol Use Not Currently     Social History   Substance and Sexual Activity  Drug Use Yes  . Types: Marijuana    Social History   Socioeconomic History  . Marital status: Married    Spouse name: Not on file  . Number of children:  Not on file  . Years of education: Not on file  . Highest education level: Not on file  Occupational History  . Not on file  Tobacco Use  . Smoking status: Current Every Day Smoker  . Smokeless tobacco: Never Used  Substance and Sexual Activity  . Alcohol use: Not Currently  . Drug use:  Yes    Types: Marijuana  . Sexual activity: Not on file  Other Topics Concern  . Not on file  Social History Narrative  . Not on file   Social Determinants of Health   Financial Resource Strain: Not on file  Food Insecurity: Not on file  Transportation Needs: Not on file  Physical Activity: Not on file  Stress: Not on file  Social Connections: Not on file   Additional Social History:   Allergies:  No Known Allergies  Labs: No results found for this or any previous visit (from the past 48 hour(s)).  Medications:  Current Facility-Administered Medications  Medication Dose Route Frequency Provider Last Rate Last Admin  . gabapentin (NEURONTIN) capsule 200 mg  200 mg Oral BID Ophelia Shoulder E, NP   200 mg at 02/10/21 0945  . OLANZapine zydis (ZYPREXA) disintegrating tablet 5 mg  5 mg Oral Daily Leevy-Johnson, Niccolas Loeper A, NP      . traZODone (DESYREL) tablet 50 mg  50 mg Oral QHS Chales Abrahams, NP       No current outpatient medications on file.    Musculoskeletal: Strength & Muscle Tone: within normal limits Gait & Station: normal Patient leans: N/A  Psychiatric Specialty Exam: Physical Exam Psychiatric:        Attention and Perception: Attention normal.        Mood and Affect: Affect is labile.        Speech: Speech is rapid and pressured and tangential.        Behavior: Behavior is aggressive. Behavior is cooperative.        Thought Content: Thought content is paranoid. Thought content includes homicidal ideation. Thought content includes homicidal plan.        Judgment: Judgment is impulsive and inappropriate.     Review of Systems  Psychiatric/Behavioral: Positive for agitation, dysphoric mood and hallucinations.  All other systems reviewed and are negative.   Blood pressure 125/81, pulse 60, temperature 98.3 F (36.8 C), temperature source Oral, resp. rate 18, height 5\' 11"  (1.803 m), weight 95.3 kg, SpO2 98 %.Body mass index is 29.3 kg/m.  General Appearance:  Casual  Eye Contact:  Fair  Speech:  Pressured  Volume:  Increased  Mood:  labile  Affect:  Labile  Thought Process:  Disorganized and Descriptions of Associations: Tangential  Orientation:  Full (Time, Place, and Person)  Thought Content:  Illogical, Ideas of Reference:   Paranoia Delusions, Paranoid Ideation and Tangential  Suicidal Thoughts:  No  Homicidal Thoughts:  Yes.  with intent/plan  Memory:  Immediate;   Fair Recent;   Fair  Judgement:  Impaired  Insight:  Shallow  Psychomotor Activity:  Increased  Concentration:  Concentration: Fair and Attention Span: Fair  Recall:  of Knowledge:  Fair  Language:  Fair  Akathisia:  NA  Handed:  Right  AIMS (if indicated):     Assets:  Communication Skills Desire for Improvement Physical Health Resilience  ADL's:  Intact  Cognition:  WNL  Sleep:      Treatment Plan Summary: Daily contact with patient to assess and evaluate symptoms and progress in  treatment, Medication management and Plan  to admit to inpatient unit when available.   Disposition: Recommend psychiatric Inpatient admission when medically cleared. Supportive therapy provided about ongoing stressors. Discussed crisis plan, support from social network, calling 911, coming to the Emergency Department, and calling Suicide Hotline.  This service was provided via telemedicine using a 2-way, interactive audio and video technology.  Names of all persons participating in this telemedicine service and their role in this encounter. Name: Maxie Barb Role: PMHNP  Name: Antonieta Pert Role: Attending MD  Name: Ailene Ravel Role: patient  Name:  Role:     Loletta Parish, NP 02/10/2021 1:13 PM

## 2021-02-10 NOTE — ED Notes (Signed)
Report given to Janet RN 

## 2021-02-11 NOTE — Progress Notes (Signed)
No available bed at [facility] due to nurse staffing shortage; Reffered out per Dr. Jola Babinski, interim medical director.  Pt. meets criteria for inpatient treatment per Maxie Barb, NP. Referred out to the following hospitals:   Destination  Service Provider Address Phone [facility]  [address] [place] [address] [phone]  [facility]  [address] [place] [address] [phone]  [facility]  1 medical [place] [address] [phone]  [facility]  [address], [place] [address] [phone]  [facility]  [address] [place], [place] [address] [phone]  [facility]  [address] [place] [address] [phone]  [facility]  [address] [place] [address] [phone]  [facility]  [address], [place] [address] [phone]  [facility]  [address], [place] [address] [phone]  [facility]  [address] [place] [address] [phone]  [facility]  [address], [place] [address] [phone]  [facility]  [address] [place] [address] [phone]  [facility]  [address] [place] [address] [phone]  [facility]  [address] [place] [address] [phone]     Disposition CSW will continue to follow for placement.    Signed:  Corky Crafts, MSW, [place], LCASA 02/11/2021 10:49 AM

## 2021-02-11 NOTE — ED Notes (Signed)
Pt at desk making a TC .

## 2021-02-11 NOTE — ED Notes (Signed)
Pt demanding to see his IVC paper work. Pt informed that when he was DC home he could go to Medical Records and get copies of all his paper work.

## 2021-02-12 NOTE — Progress Notes (Signed)
Patient referred to [facility] inpatient behavioral health as requested. Dispositions CSW will continue to follow.   Signed:  Corky Crafts, MSW, [place], LCASA 02/12/2021 8:50 AM

## 2021-02-12 NOTE — Progress Notes (Addendum)
Patient information has been sent to [facility] via secure chat to review for potential admission. Patient meets inpatient criteria per Maxie Barb, NP.   No available beds at this time.    Signed:  Corky Crafts, MSW, [place], LCASA 02/12/2021 10:32 AM

## 2021-02-12 NOTE — ED Notes (Signed)
Pt awake and requested  A drink.

## 2021-02-12 NOTE — ED Notes (Signed)
Pt awake and sitting up eating dinner. Pt reported he feels better.

## 2021-02-12 NOTE — ED Notes (Signed)
PT is requesting to be transferred to [facility] . [facility] called to report Pt is requesting the transfer

## 2021-02-12 NOTE — ED Provider Notes (Signed)
Emergency Medicine Observation Re-evaluation Note  Justin Neal is a 45 y.o. male, seen on rounds today.  Pt initially presented to the ED for complaints of Medical Clearance Currently, the patient is waiting in the ER for inpt psych treatment bed.  Physical Exam  BP 114/69   Pulse 87   Temp (!) 97.5 F (36.4 C) (Oral)   Resp 16   Ht 5\' 11"  (1.803 m)   Wt 95.3 kg   SpO2 100%   BMI 29.30 kg/m  Physical Exam General: sleeping in bed Cardiac: RRR Lungs: no respiratory discomfort, normal chest rise Psych: sleeping  ED Course / MDM  EKG:EKG Interpretation  Date/Time:  Sunday February 08 2021 12:25:09 EDT Ventricular Rate:  48 PR Interval:  166 QRS Duration: 102 QT Interval:  404 QTC Calculation: 360 R Axis:   84 Text Interpretation: Sinus bradycardia Otherwise normal ECG When compared with ECG of 11/18/2011, No significant change was found Confirmed by 11/20/2011 (Dione Booze) on 02/08/2021 11:01:55 PM   I have reviewed the labs performed to date as well as medications administered while in observation.  Recent changes in the last 24 hours include pt referred to [facility] inpt behavioral health per request.  Plan  Current plan is for referred to [facility] inpt behavioral health as requested. Patient is under full IVC at this time.   [facility], PA-C 02/12/21 02/14/21    [id number], MD 02/12/21 1136

## 2021-02-13 MED ORDER — OLANZAPINE 5 MG PO TABS: 5.0000 mg | ORAL_TABLET | Freq: Every day | ORAL | 0 refills | Status: DC

## 2021-02-13 MED ORDER — GABAPENTIN 100 MG PO CAPS: 200.0000 mg | ORAL_CAPSULE | Freq: Two times a day (BID) | ORAL | 0 refills | Status: DC

## 2021-02-13 NOTE — Progress Notes (Signed)
Patient information has been sent to [facility] via secure chat to review for potential admission. Patient meets inpatient criteria per [facility].   Situation ongoing, CSW will continue to monitor progress.    Signed:  Corky Crafts, MSW, LCSWA, LCASA 02/13/2021 10:05 AM

## 2021-02-13 NOTE — ED Notes (Signed)
This RN was informed by Milinda Antis, NP that pt is medically cleared and IVC papers can be rescinded. Paperwork has been filled out & being faxed at this time to [employer].

## 2021-02-13 NOTE — Consult Note (Addendum)
Telepsych Consultation   Reason for Consult:  HI IVC Referring Physician:  Tanda Rockers PA-C Location of Patient: [facility] Location of Provider: Behavioral Health TTS Department  Patient Identification: Justin Neal MRN:  [id number] Principal Diagnosis: Homicidal ideations Diagnosis:  Principal Problem:   Homicidal ideations Active Problems:   Schizophrenia (HCC)   Total Time spent with patient: 20 minutes  Subjective:   Justin Neal is a 45 y.o. male patient admitted with homicidal ideations.  "I'm feeling a lot better. I'm in good spirits. 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 I need help with someone to stay. I have family here but not anyone I can live with".  Patient presents calm and cooperative. Consistent eye contact; mood congruent and even. Thought processes logical and linear. Patient states he does not get disability and wants to get his PA benefits switched to [place]. Provider discussed services provided at [facility] that require no insurance; patient expressed interest and states he plans on following up with "I just need my medications and directions on how to get there".   Patient states he feels better with current medications, States he was recently moved to a different side of the hospital and things that would usually trigger me haven't been so I know it's working. I can tell the difference". Patient denies any active suicidal or homicidal ideations. Patient denies any auditory or visual hallucinations, and does not appear actively psychotic or responding to any external/internal stimuli at this time. Patient is talking linear and logically; goal directed and making plans for his immediate future. Patient provided name of sister as support; gave verbal permission for provider to contact sister to discuss discharge and safety planning.   Collateral: [employer] [phone] States she is supportive to patient, she used to be his "safety place"  but can't do that due to past issues and she has custody of her two grandchildren. States she is maintains contact with patient and is willing to support patient "but I'm unable to let him stay here because I have my grandchildren here". Sister denies having any safety concerns and states she is comfortable with patient discharging. Provider discussed recommendation for patient to follow up at [facility] for outpatient care, services provided, and different services in [facility] ([facility], [place], [place]).   HPI:   Justin Neal is a 45 year old male who initially presented to [facility] for assessment "to get back in the system", due to patient's acute presentation he was then transferred to [facility] for further observation, stabilization, and treatment. Patient reports past psychiatric history of schizophrenia and no active outpatient treatment. Last time in any outpatient treatment not clear due to patient's current state. Per chart review, patient was seen by [facility] inpatient in 2011; no diagnosis noted.   Past Psychiatric History:   -schizophrenia (patient reported)  Risk to Self:  pt denies Risk to Others:  pt denies Prior Inpatient Therapy:  pt denies Prior Outpatient Therapy:  pt denies  Past Medical History:  Past Medical History:  Diagnosis Date  . Schizophrenia (HCC)    History reviewed. No pertinent surgical history. Family History: No family history on file. Family Psychiatric  History: not noted Social History:  Social History   Substance and Sexual Activity  Alcohol Use Not Currently     Social History   Substance and Sexual Activity  Drug Use Yes  . Types: Marijuana    Social History   Socioeconomic History  .  Marital status: Married    Spouse name: Not on file  . Number of children: Not on file  . Years of education: Not on file  . Highest education level: Not on file  Occupational History  . Not on file  Tobacco Use  . Smoking status: Current Every  Day Smoker  . Smokeless tobacco: Never Used  Substance and Sexual Activity  . Alcohol use: Not Currently  . Drug use: Yes    Types: Marijuana  . Sexual activity: Not on file  Other Topics Concern  . Not on file  Social History Narrative  . Not on file   Social Determinants of Health   Financial Resource Strain: Not on file  Food Insecurity: Not on file  Transportation Needs: Not on file  Physical Activity: Not on file  Stress: Not on file  Social Connections: Not on file   Additional Social History:   Allergies:  No Known Allergies  Labs: No results found for this or any previous visit (from the past 48 hour(s)).  Medications:  Current Facility-Administered Medications  Medication Dose Route Frequency Provider Last Rate Last Admin  . gabapentin (NEURONTIN) capsule 200 mg  200 mg Oral BID Ophelia Shoulder E, NP   200 mg at 02/13/21 1020  . OLANZapine zydis (ZYPREXA) disintegrating tablet 5 mg  5 mg Oral Daily Leevy-Johnson, Brooke A, NP   5 mg at 02/13/21 1021  . traZODone (DESYREL) tablet 50 mg  50 mg Oral QHS Chales Abrahams, NP       No current outpatient medications on file.   Musculoskeletal: Strength & Muscle Tone: within normal limits Gait & Station: normal Patient leans: N/A  Psychiatric Specialty Exam: Physical Exam Psychiatric:        Attention and Perception: Attention and perception normal.        Mood and Affect: Mood and affect normal.        Speech: Speech normal.        Behavior: Behavior normal. Behavior is cooperative.        Thought Content: Thought content normal.        Cognition and Memory: Cognition normal.        Judgment: Judgment normal.     Review of Systems  Psychiatric/Behavioral: Negative.   All other systems reviewed and are negative.   Blood pressure 124/84, pulse 87, temperature 98.1 F (36.7 C), temperature source Oral, resp. rate 17, height 5\' 11"  (1.803 m), weight 95.3 kg, SpO2 96 %.Body mass index is 29.3 kg/m.  General  Appearance: Casual and Fairly Groomed  Eye Contact:  Good  Speech:  Clear and Coherent  Volume:  Normal  Mood:  Euthymic  Affect:  Appropriate and Congruent  Thought Process:  Coherent and Goal Directed  Orientation:  Full (Time, Place, and Person)  Thought Content:  WDL and Logical  Suicidal Thoughts:  No  Homicidal Thoughts:  No  Memory:  Immediate;   Fair Recent;   Fair Remote;   Fair  Judgement:  Intact  Insight:  Present  Psychomotor Activity:  Normal  Concentration:  Concentration: Fair and Attention Span: Fair  Recall:  of Knowledge:  Fair  Language:  Fair  Akathisia:  NA  Handed:  Right  AIMS (if indicated):     Assets:  Communication Skills Desire for Improvement Leisure Time Physical Health Resilience Social Support Talents/Skills Vocational/Educational  ADL's:  Intact  Cognition:  WNL  Sleep:      Treatment Plan Summary: Plan  to discharge patient with resources for outpatient follow-up. Provider discussed and provided information on [facility] walk-in process to establish services for continuity of care; patient verbalized an understanding and intent to follow-up at [facility]. SW consult placed to assist patient with housing and shelter resources. Provider messaged EDP to ensure prescriptions provided for Gabapentin 200mg  BID, Olanzapine 5mg  daily; patient agrees to follow up with [facility] to establish care and continued medication management.   Disposition: No evidence of imminent risk to self or others at present.   Patient does not meet criteria for psychiatric inpatient admission. Supportive therapy provided about ongoing stressors. Discussed crisis plan, support from social network, calling 911, coming to the Emergency Department, and calling Suicide Hotline.  This service was provided via telemedicine using a 2-way, interactive audio and video technology.  Names of all persons participating in this telemedicine service and their role in this  encounter. Name: Role: PMHNP  Name: [facility] Role: Attending MD  Name: Justin Neal Role: patient  Name: Antonieta Pert Role: sister    Evaristo Bury, NP 02/13/2021 3:05 PM

## 2021-02-13 NOTE — ED Provider Notes (Signed)
Emergency Medicine Observation Re-evaluation Note  Justin Neal is a 45 y.o. male, seen on rounds today.  Pt initially presented to the ED for complaints of Medical Clearance Currently, the patient is sleeping, but per report has been in no distress.Marland Kitchen  Physical Exam  BP (!) 131/91   Pulse 99   Temp (!) 97.4 F (36.3 C) (Oral)   Resp 18   Ht 5\' 11"  (1.803 m)   Wt 95.3 kg   SpO2 95%   BMI 29.30 kg/m  Physical Exam General: Sleeping, in no distres Cardiac: No noted tachycardia Lungs: No increased work of breathin Psych: Sleeping now, but noted to have ongoing homicidal ideation  ED Course / MDM  EKG:EKG Interpretation  Date/Time:  Sunday February 08 2021 12:25:09 EDT Ventricular Rate:  48 PR Interval:  166 QRS Duration: 102 QT Interval:  404 QTC Calculation: 360 R Axis:   84 Text Interpretation: Sinus bradycardia Otherwise normal ECG When compared with ECG of 11/18/2011, No significant change was found Confirmed by 11/20/2011 (Dione Booze) on 02/08/2021 11:01:55 PM   I have reviewed the labs performed to date as well as medications administered while in observation.  Recent changes in the last 24 hours include none.  Plan  Current plan is for inpatient hospitalization. Patient is under full IVC at this time.   02/10/2021, MD 02/13/21 1336

## 2021-02-13 NOTE — ED Provider Notes (Signed)
Nursing staff informed that pt was evaluated by TTS and cleared for discharge. IVC paperwork has been rescinded at this time and pt discharged home.    Tanda Rockers, PA-C 02/13/21 1737    Gerhard Munch, MD 02/16/21 [phone]

## 2021-02-13 NOTE — ED Notes (Signed)
TTS in use at this time. 

## 2021-02-13 NOTE — ED Notes (Signed)
Patient was given a Cup of Coffee, and Graham Crackers w/ peanut Butter.

## 2021-02-13 NOTE — Social Work (Signed)
CSW met with Pt at bedside. Pt reports that his car is still at [facility] in parking lot.  Pt reports that he has nowhere to stay tonight but will sleep in car if he cannot find a shelter bed.  CSW supplied shelter resources. Pt is already familiar with [facility]. CSW gave [employer] and a bus pass for transport needs.  CSW also spoke with Pt about [facility] program. Pt gave verbal permission for CSW to make referral and CSW and Pt filled out referral form together. Referral was faxed to [facility] of [facility]. Pt expressed gratitude. 
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
## 2021-02-13 NOTE — ED Notes (Signed)
Pt was given all of his belongings from locker & security, His D/C papers have been explained/underestood, bus pass was given as well before pt's departure.

## 2021-02-13 NOTE — ED Notes (Signed)
EDP is finishing up pt's D/C papers, pt has been given his belongings and he is getting dressed.
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
## 2022-09-01 IMAGING — CR DG CHEST 2V
2 series · 2 of 2 positions shown · non-contrast
Comparison: 08/25/2020

CLINICAL DATA: Schizophrenia, tobacco abuse

EXAM:
CHEST - 2 VIEW

[chest pa]
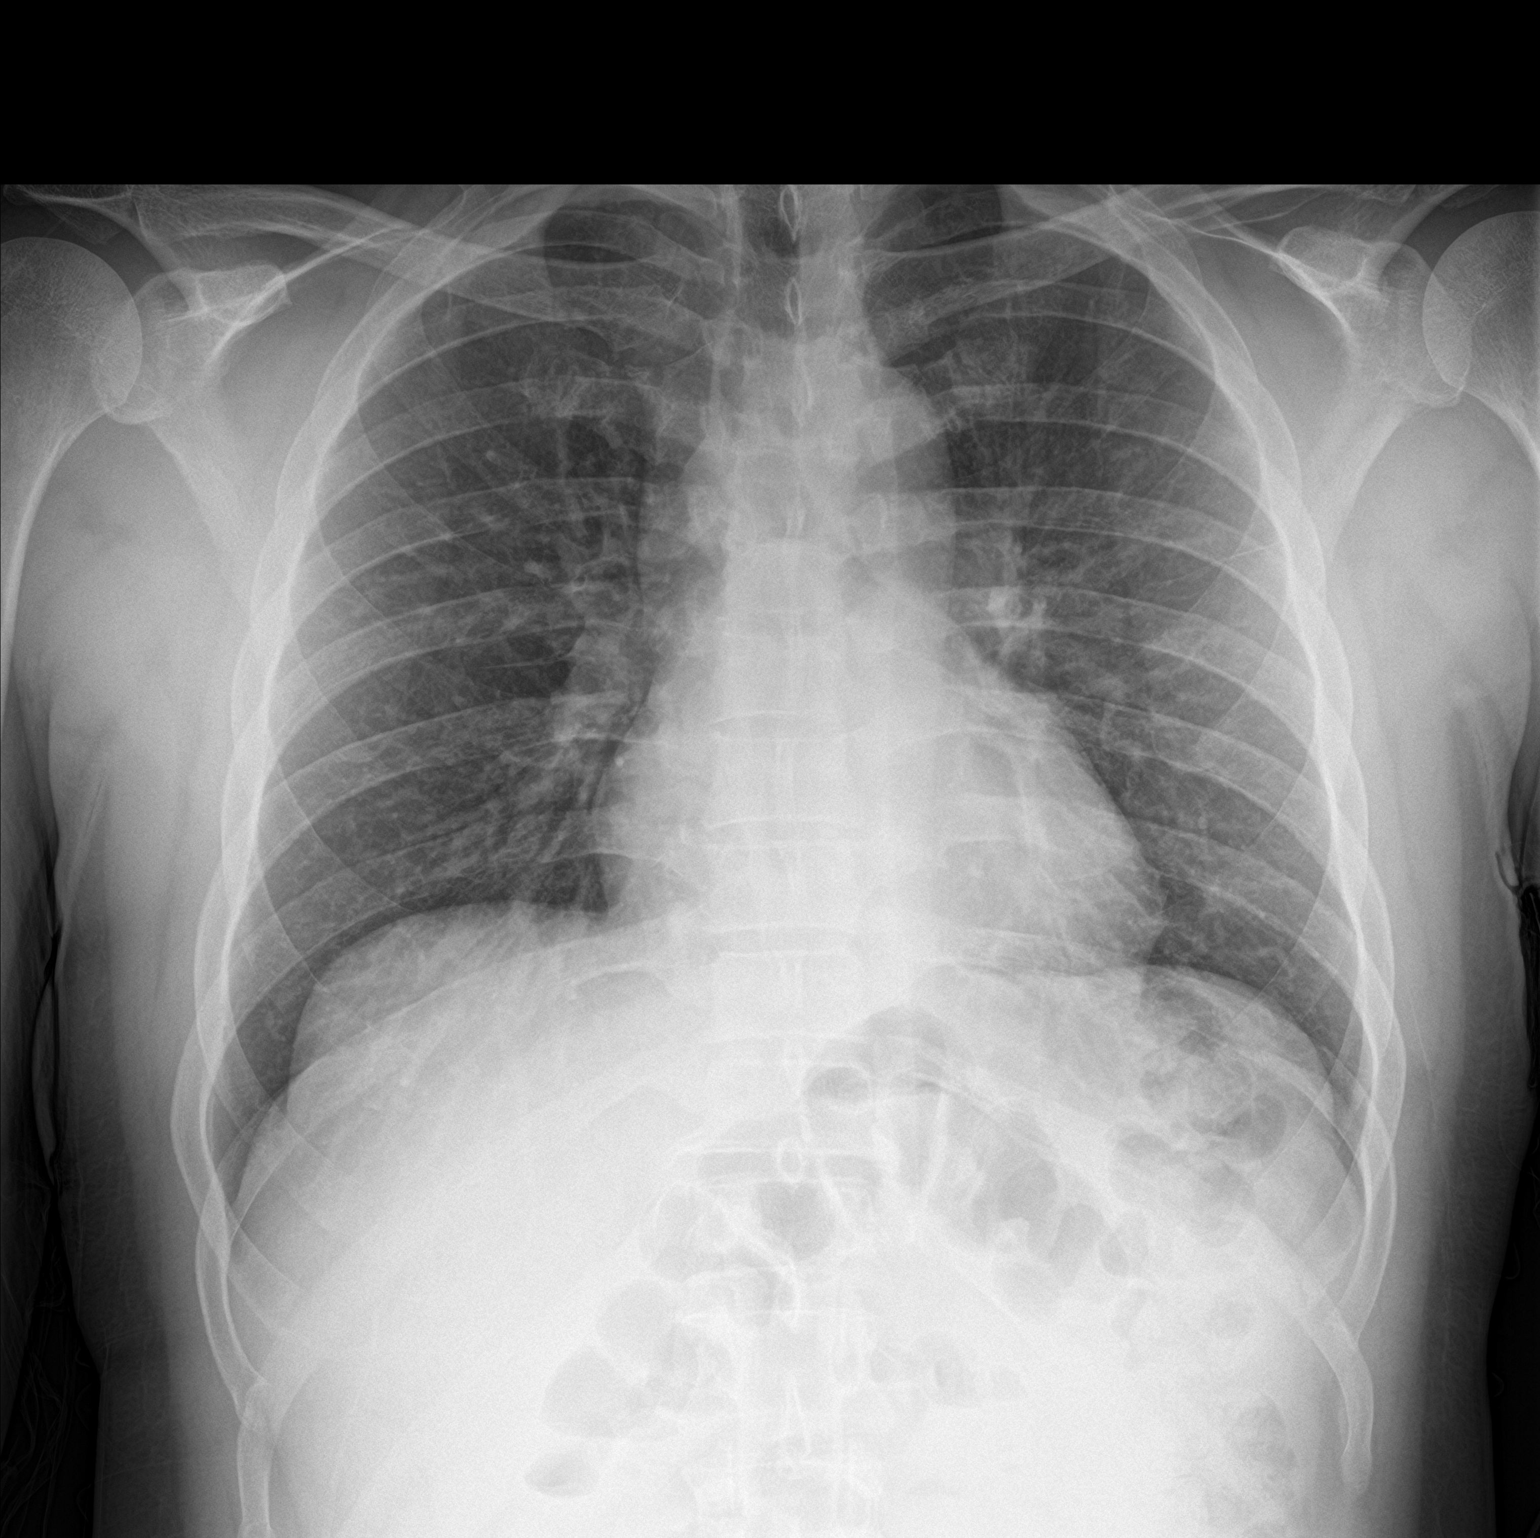

[chest lat]
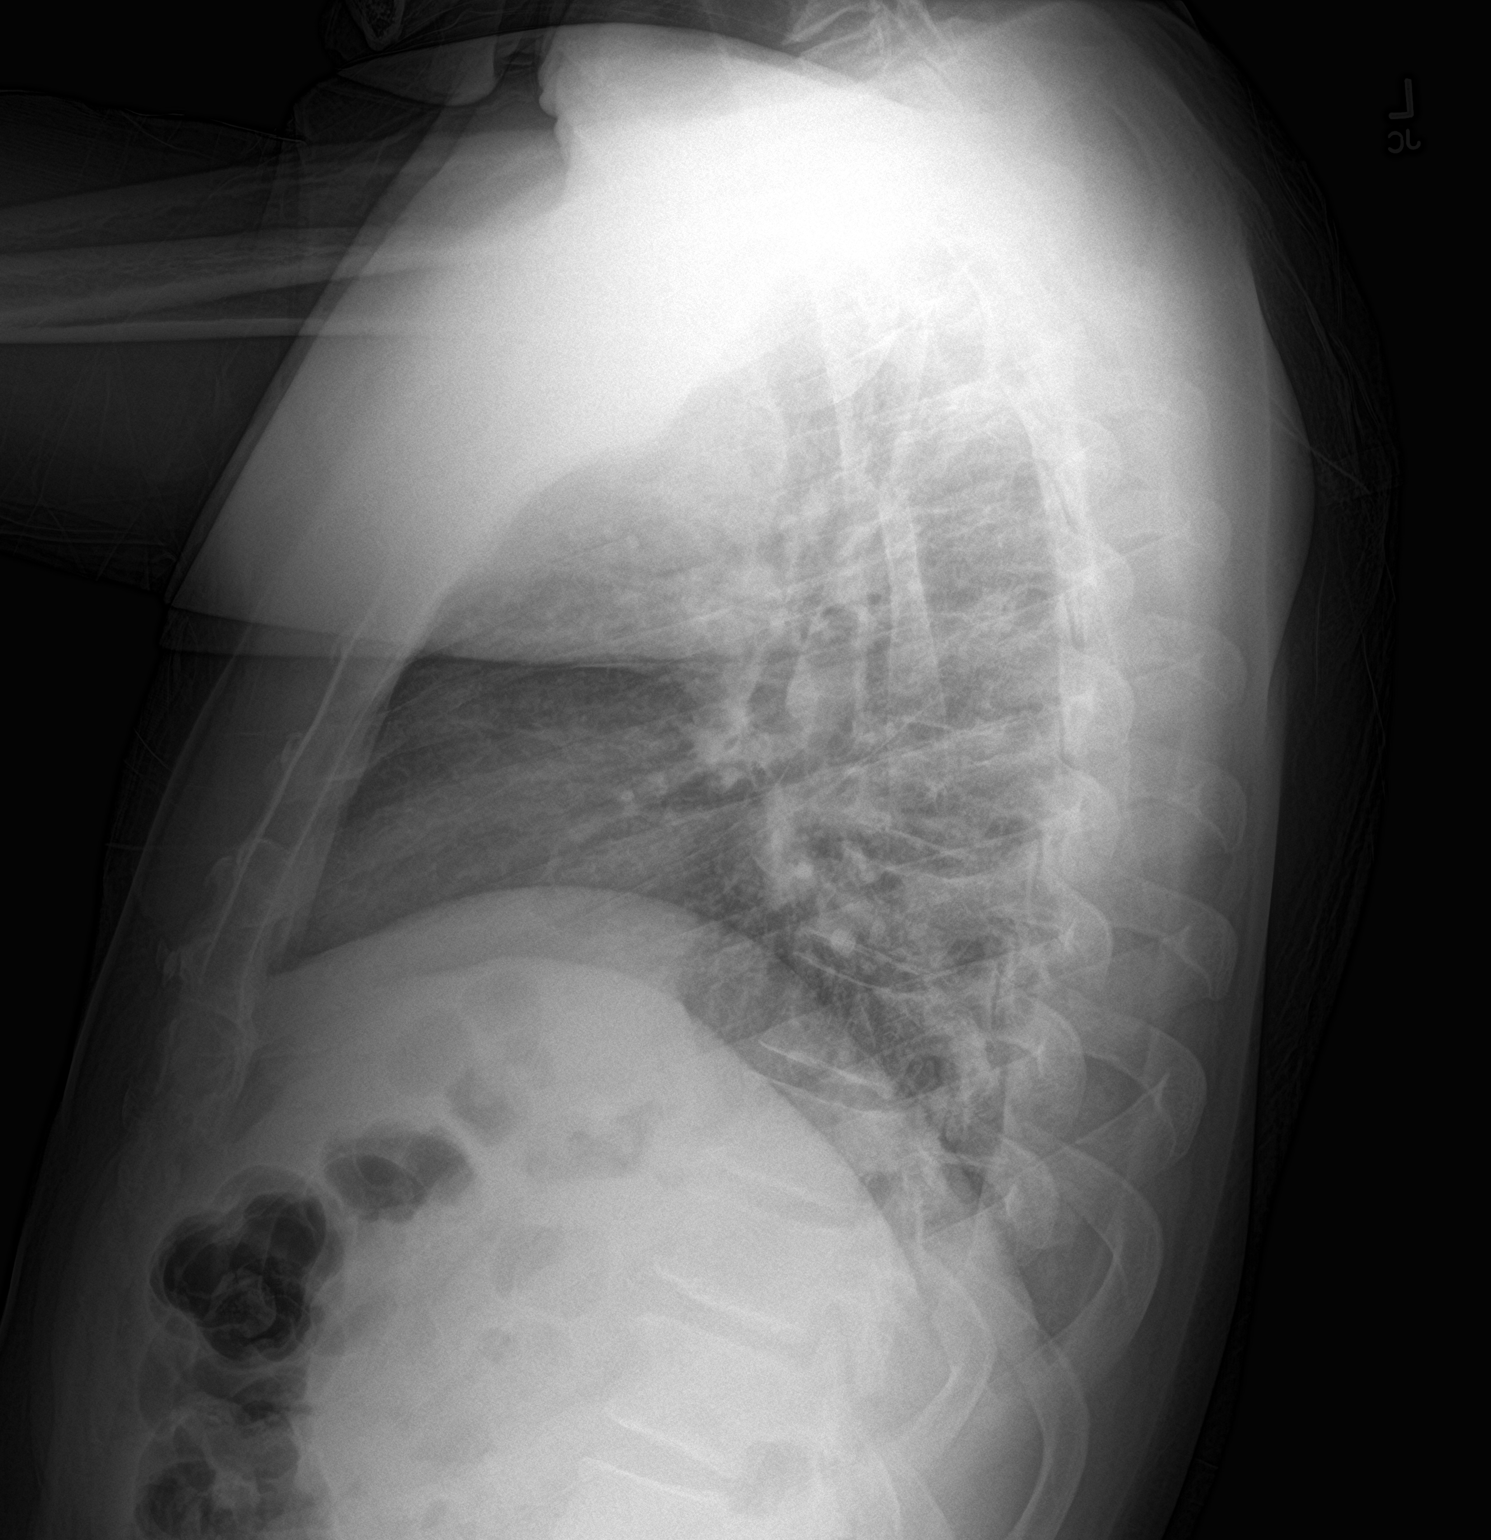

[2 of 2 positions shown; findings below may reference images not displayed]

FINDINGS: The heart size and mediastinal contours are within normal limits.
Both lungs are clear. The visualized skeletal structures are
unremarkable.
IMPRESSION: No active cardiopulmonary disease.

## 2023-06-17 DIAGNOSIS — Z760 Encounter for issue of repeat prescription: Secondary | ICD-10-CM | POA: Diagnosis not present

## 2023-06-17 DIAGNOSIS — F32A Depression, unspecified: Secondary | ICD-10-CM | POA: Diagnosis not present

## 2023-06-17 DIAGNOSIS — F1020 Alcohol dependence, uncomplicated: Secondary | ICD-10-CM | POA: Diagnosis not present

## 2023-06-17 NOTE — ED Triage Notes (Signed)
 Pt here sts wants psych eval, also psych meds reeval At this time with NF denies SI\HI No apparent distress

## 2023-06-18 DIAGNOSIS — F1020 Alcohol dependence, uncomplicated: Secondary | ICD-10-CM | POA: Diagnosis not present
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
## 2024-01-02 NOTE — ED Triage Notes (Signed)
 Endorses SI with no plan. HI towards people in society I don't like. No plan just states something crazy. Guarded and evasive. Homeless. Reports cocaine, etoh, and mj use.

## 2024-01-07 NOTE — Progress Notes (Signed)
 ------------------------------------------------------------------------------- Attestation signed by Sanford Greig Fitch, MD at 01/08/24 0945 I have seen, interviewed and evaluated the patient, participating in the key portions of the service.  I discussed the case with the resident and I agree with the findings and plan as documented in the Resident's note. Amy Fitch Sanford, MD I personally spent 35 minutes face-to-face and non-face-to-face in the care of this patient, which includes all pre, intra, and post visit time on the date of service.  All documented time was specific to the E/M visit and does not include any procedures that may have been performed.  -------------------------------------------------------------------------------  Psychiatry     Daily Progress Note   Admit date/time: 01/02/2024 12:56 PM  LOS: 5 days    Assessment:  Justin Neal is a 48 y.o.  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 Schizoaffective d/o vs. Schizophrenia) being admitted to [facility] Psychiatric inpatient service for SI and HI and worsening depression.    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 Given history of violence / aggression and prior [facility] admission for HI / psychotic symptoms, he remains hospitalized. He is noted to be tangential, labile, irritable, and paranoid. He caries historical diagnoses of schizophrenia and schizoaffective disorder, though at this time it is not entirely clear to what extent his substance use has contributed to his presentation. It is unclear at this time whether paranoia is present at baseline. Hospitalization is warranted for treatment, management, and stabilization of psychotic and mood symptoms.   As of 01/07/2024, continued hospitalization is warranted given need for safety, stabilization, medication and treatment management, and coping  skill development. Will continue to monitor for efficacy and side effects. Homicidal ideation has improved. Paranoia improved. He does feel safe and support while on the unit but is concerned that people will do him harm outside. Encouraged him to think about places that he would feel safe outside.   WEEKEND: 2/8: Speech was rapid and pressured. Paranoid at times. Difficult to redirect. Focused on his PTSD not being treated. Tolerating Zyprexa . Encouraged use of ativan  PRN. No changes to plan of care. 2/9:  Diagnoses:  Active Problems:   Paranoia (CMS-HCC)   Psychosis (CMS-HCC)   Plan: Safety: -- Continue admission to inpatient psychiatric unit for safety, stabilization, and treatment.   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) -- Increased Zyprexa  from 10 mg to 15 mg daily [5 mg in the morning and 10 mg at night] + 5mg  TID PRN severe agitation / anxiety -- Melatonin 3mg  at bedtime PRN, 1st line sleep -- Trazodone  50mg  at bedtime PRN, 2nd line sleep -- Hydroxyzine  25mg  Q6H PRN, 1st line anxiety -- Ativan  1mg  BID PRN, severe agitation / anxiety (in case of EFM use, do not give IM Ativan  within 1 hour of IM Zyprexa  due to increased risk of respiratory depression)    # Substance Use (crack cocaine, THC, EtOH, nicotine) No c/f for EtOH withdrawal at this time -- Recommend cessation from substances, which may cause / worsen mood / psychotic symptoms -- NRT (21mg  patch + gum)    # Therapy Interventions: -- RT, OT, Milieu therapy   Medical: # General -- PRN Tylenol , pain / headache / fever -- PRN Senna, constipation --  Metabolic Monitoring: A1c/lipid panel  #Sinus Congestion -- Flonase daily  Social/Disposition: -- Continue hospitalization at this time.  -- Primary  team to follow up with family, outpatient resources  Patient was seen and plan of care was discussed with the Attending MD, Sanford , who agrees with the above statement and plan.   Abby Deforest, MD   Subjective:  Hours of sleep overnight :  7.12   Weekend rounding  Per 01/06/24 RN Epic note: reviewed  MD interview:  Initially was sleeping, but roused to participate in the interview. Says Zyprexa  has been helpful. Frustrated no one has treated his PTSD. Then tells a prolonged account about all the grievances that have occurred in his life, how people have treated him, and his life circumstances. Shared more about how he needs to get all of his social services together and have a solid game plan before he discharges. Didn't sleep great last night. Denies SI/HI/AVH  Objective:  Vitals: Vitals:   01/07/24 0800  BP: 132/91  Pulse: 69  Resp: 18  Temp: 36.2 C (97.2 F)  SpO2: 99%    Mental Status Exam:  Appearance:    Appears stated age  Motor:   No abnormal movements  Speech/Language:    Normal rate, volume, tone, fluency and normal-to-loud volume. Hyperverbal but redirectable  Mood:   Calm  I am alright, had a bad day yesterday but doing better   Affect:  Cooperative / euthymic  Thought process:   Disorganized and Tangential  Thought content:     Denies SI, HI, self harm, delusions, obsessions, paranoid ideation, or ideas of reference  Perceptual disturbances:     Denies auditory and visual hallucinations, behavior not concerning for response to internal stimuli   Insight:     Impaired, improved compared to yesterday  Judgment:    Impaired  Impulse Control:   Impaired  Other:     PE:  Gen: No acute distress, well developed and well nourished Resp: Normal work of breathing Neuro: no tics/tremors  Test Results: Data Review: Lab results last 24 hours:   No results found for this or any previous visit (from the past 24 hours).  Imaging:  None  Psychometrics:  Social Drivers of Health with Concerns   Food Insecurity: Food Insecurity Present (01/03/2024)   Hunger Vital Sign   . Worried About Programme researcher, broadcasting/film/video in the Last Year: Often true   . Ran Out of Food in the Last Year: Often true  Internet Connectivity: Not on file  Housing/Utilities: High Risk (01/03/2024)   Housing/Utilities   . Within the past 12 months, have you ever stayed: outside, in a car, in a tent, in an overnight shelter, or temporarily in someone else's home (i.e. couch-surfing)?: Yes   . Are you worried about losing your housing?: Yes   . Within the past 12 months, have you been unable to get utilities (heat, electricity) when it was really needed?: Yes  Tobacco Use: High Risk (01/04/2024)   Patient History   . Smoking Tobacco Use: Every Day   . Smokeless Tobacco Use: Never   . Passive Exposure: Not on file  Transportation Needs: Unmet Transportation Needs (01/03/2024)   PRAPARE - Transportation   . Lack of Transportation (Medical): Yes   . Lack of Transportation (Non-Medical): Yes  Interpersonal Safety: Not on file  Physical Activity: Not on file  Intimate Partner Violence: Not on file  Stress: Not on file  Substance Use: Not on file (10/09/2023)  Social  Connections: Not on file  Financial Resource Strain: High Risk (01/03/2024)   Overall Financial Resource Strain (CARDIA)   . Difficulty of Paying Living Expenses: Very hard  Depression: Not on file  Health Literacy: Not on file     --- Time-based billing disclaimer: <redacted file path> I personally spent 25  minutes face-to-face and non-face-to-face in the care of this patient, which includes all pre, intra, and post visit time on the date of service.  All documented time was specific to the E/M visit and does not include any procedures that may have been performed.

## 2024-01-08 NOTE — Progress Notes (Signed)
 ------------------------------------------------------------------------------- Attestation signed by Sanford Greig Fitch, MD at 01/09/24 [phone] I have seen, interviewed and evaluated the patient, participating in the key portions of the service.  I discussed the case with the resident and I agree with the findings and plan as documented in the Resident's note. Amy Fitch Sanford, MD I personally spent 45 minutes face-to-face and non-face-to-face in the care of this patient, which includes all pre, intra, and post visit time on the date of service.  All documented time was specific to the E/M visit and does not include any procedures that may have been performed.  -------------------------------------------------------------------------------  Psychiatry     Daily Progress Note   Admit date/time: 01/02/2024 12:56 PM  LOS: 6 days    Assessment:  Justin Neal is a 48 y.o.  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 Schizoaffective d/o vs. Schizophrenia) being admitted to [facility] Psychiatric inpatient service for SI and HI and worsening depression.    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 Given history of violence / aggression and prior [facility] admission for HI / psychotic symptoms, he remains hospitalized. He is noted to be tangential, labile, irritable, and paranoid. He caries historical diagnoses of schizophrenia and schizoaffective disorder, though at this time it is not entirely clear to what extent his substance use has contributed to his presentation. It is unclear at this time whether paranoia is present at baseline. Hospitalization is warranted for treatment, management, and stabilization of psychotic and mood symptoms.   As of 01/08/2024, continued hospitalization is warranted given need for safety, stabilization, medication and treatment management, and coping  skill development. Will continue to monitor for efficacy and side effects. Homicidal ideation has improved. Paranoia improved. He does feel safe and support while on the unit but is concerned that people will do him harm outside. Encouraged him to think about places that he would feel safe outside.   WEEKEND: 2/8: Speech was rapid and pressured. Paranoid at times. Difficult to redirect. Focused on his PTSD not being treated. Tolerating Zyprexa . Encouraged use of ativan  PRN. No changes to plan of care. 2/9: Remains irritable, pressured, paranoid. Tolerating Zyprexa . Increasing nighttime zyprexa  dose to 12.5 mg for a TDD of 17.5mg  (5qAM + 12.5 qPM). For now encouraged use of ativan  PRN and changed to primary PRN for anxiety. Discontinued hydroxyzine  given lack of benefit. Discontinued trazodone  as he has not used this.  Diagnoses:  Active Problems:   Paranoia (CMS-HCC)   Psychosis (CMS-HCC)   Plan: Safety: -- Continue admission to inpatient psychiatric unit for safety, stabilization, and treatment.   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) -- INCREASED Zyprexa  from 5 mg in the morning and 12.5 mg at night + 5mg  TID PRN severe agitation / anxiety -- Melatonin 3mg  at bedtime PRN, 1st line sleep -- DISCONTINUED Trazodone  50mg  at bedtime PRN, 2nd line sleep -- DISCONTINUED Hydroxyzine  25mg  Q6H PRN, 1st line anxiety -- CHANGED Ativan  1mg  BID PRN FOR anxiety and severe agitation (in case of EFM use, do not give IM Ativan  within 1 hour of IM Zyprexa  due to increased risk of respiratory depression)    # Substance Use (crack cocaine, THC, EtOH, nicotine) No c/f for EtOH withdrawal at this time --  Recommend cessation from substances, which may cause / worsen mood / psychotic  symptoms -- NRT (21mg  patch + gum)    # Therapy Interventions: -- RT, OT, Milieu therapy   Medical: # General -- PRN Tylenol , pain / headache / fever -- PRN Senna, constipation -- Metabolic Monitoring: A1c/lipid panel  #Sinus Congestion -- Flonase daily  Social/Disposition: -- Continue hospitalization at this time.  -- Primary team to follow up with family, outpatient resources  Patient was seen and plan of care was discussed with the Attending MD, Sanford , who agrees with the above statement and plan.   Abby Deforest, MD   Subjective:  Hours of sleep overnight :  6.63   Weekend rounding  Per 01/07/24 RN Epic note: reviewed  MD interview:  [place] in room while laying in bed. Irritable. Speaks more about the injustices that have happened to him, being cheated by other people which have made it hard for him to trust others. Mentions having thoughts of hurting people in the community who have wronged him and stolen from him, but no specific names. Sleep has been ok, but gets woken up by checks. The 3 meds he takes at night are helpful for sleep. Wants his PTSD treated but then also speaks about us  needing to get the right diagnosis and to appropriately reflect that in his chart so he can get disability. Had all the right diagnoses in [place] and wants us  to get the records. Also wants to speak with social work. Does not want to go back to [place] and then starts to spiral and say f*ck a number of times and becomes quite angry about his past. Denies SI/HI currently.   Objective:  Vitals: Vitals:   01/07/24 1953  BP: 127/87  Pulse: 107  Resp: 18  Temp: 36.4 C (97.5 F)  SpO2: 100%    Mental Status Exam:  Appearance:    Appears stated age  Motor:   No abnormal movements  Speech/Language:    Normal rate, volume, tone, fluency and normal-to-loud volume. Hyperverbal but redirectable  Mood:   Calm  I am alright, had a bad day yesterday but doing better    Affect:  Cooperative / euthymic  Thought process:   Disorganized and Tangential  Thought content:     Denies SI, HI, self harm, delusions, obsessions, paranoid ideation, or ideas of reference  Perceptual disturbances:     Denies auditory and visual hallucinations, behavior not concerning for response to internal stimuli   Insight:     Impaired, improved compared to yesterday  Judgment:    Impaired  Impulse Control:   Impaired  Other:     PE:  Gen: No acute distress, well developed and well nourished Resp: Normal work of breathing Neuro: no tics/tremors  Test Results: Data Review: Lab results last 24 hours:   No results found for this or any previous visit (from the past 24 hours).  Imaging: None  Psychometrics:  Social Drivers of Health with Concerns   Food Insecurity: Food Insecurity Present (01/03/2024)   Hunger Vital Sign   . Worried About Programme researcher, broadcasting/film/video in the Last Year: Often true   . Ran Out of Food in the Last Year: Often true  Internet Connectivity: Not on file  Housing/Utilities: High Risk (01/03/2024)   Housing/Utilities   . Within the past 12 months, have you ever stayed: outside, in a car, in a tent, in an overnight shelter, or temporarily in someone else's home (i.e. couch-surfing)?: Yes   .  Are you worried about losing your housing?: Yes   . Within the past 12 months, have you been unable to get utilities (heat, electricity) when it was really needed?: Yes  Tobacco Use: High Risk (01/04/2024)   Patient History   . Smoking Tobacco Use: Every Day   . Smokeless Tobacco Use: Never   . Passive Exposure: Not on file  Transportation Needs: Unmet Transportation Needs (01/03/2024)   PRAPARE - Transportation   . Lack of Transportation (Medical): Yes   . Lack of Transportation (Non-Medical): Yes  Interpersonal Safety: Not on file  Physical Activity: Not on file  Intimate Partner Violence: Not on file  Stress: Not on file  Substance Use: Not on file (10/09/2023)   Social Connections: Not on file  Financial Resource Strain: High Risk (01/03/2024)   Overall Financial Resource Strain (CARDIA)   . Difficulty of Paying Living Expenses: Very hard  Depression: Not on file  Health Literacy: Not on file     --- Time-based billing disclaimer: <redacted file path> I personally spent 35  minutes face-to-face and non-face-to-face in the care of this patient, which includes all pre, intra, and post visit time on the date of service.  All documented time was specific to the E/M visit and does not include any procedures that may have been performed.

## 2024-01-20 NOTE — Consults (Signed)
 Emergency Psychiatry - Initial Evaluation Triage note:Pt ambulated to treatment room for psych eval. Pt states he wants to be put back on psych mends as he is concerned his mental health is worsening. Pt states he would like to speak to a psychiatrist. Pt denies current SI/HI.   Subjective HPI: Patient is a 48 year old male with history of depression anxiety, reported schizophrenia, polysubstance use presented to the emergency room for psych evaluation and concerns about psychiatric medications. On interview, patient reports that he has been going through anxiety and depression for many years.  He describes paranoia in a detailed way where he feels that he does not want to stay around people and wants to isolate himself.  He reports crowd of people irritates him and he feels they are plotting against him.  He did acknowledge recent admission at [facility] inpatient psychiatric facility and was discharged on Lexapro , Zyprexa , BuSpar .  He reports that he went back home in [place], [place] but reportedly he found no resources so he came back to this area.  He reports that he talk to his attorney for disability and his attorney informed him that he needs to continue his medications and follow the treatment plan so that they can apply for disability for the third time.  Patient reports that when he got discharged from [facility] he was not given enough medications and reports that he has not been scheduled any follow-up appointments with outpatient psychiatrist.  He also reported that he is interested in long-term substance use rehabs.  He reports feeling depressed, but has fair energy and motivation to get help, reports hopelessness and worthlessness at times, denies active SI/HI/intent/plan.  He denies auditory/visual hallucinations but displays paranoia about people in the community.  He denies any ideas of reference.  He talks about losing multiple family members but has not endorsed any nightmares or flashbacks  regarding any previous trauma or abuse.  Per chart patient has childhood trauma.  He is hyperverbal but is not displaying any grandiose delusions.  He denies current symptoms of mania/hypomania.  He reports being away from drugs and alcohol since his discharge from [facility].  He reports his drug of choice is cocaine and last use is before the admission to [facility].  Patient was discharged from [facility] inpatient facility on 01/02/2024.  Today he is requesting for prescriptions of his medication and is agreeable to call the long-term substance use rehab places to see if he can get into them.  Their symptoms have been associated with: no significant impairment in their social or occupational functioning  Collateral information: no family involved in his life in the recent past   ROS Psychiatric and Psychosocial History <redacted file path> Psychiatric History    Psychiatric History   Prior Diagnoses: Anxiety, depression,PTSD, polysubstnace use, schizophrenia Hospitalizations: Recently at [facility] 12/2023 Current Provider(s): Patient is not following up with any outpatient Past Provider(s): None reported Past treatment modalities: No ECT Past suicidal/self-harm behavior: No suicide attempts in the past Past aggression/violence: Denies Family Psych History: Depression in family but no deaths by suicide  Name Max dose Date(s)/Age(s)/Year(s) Indication Efficacy Side effects/Reason for stopping   Lexapro  15 mg  Anxiety/depression     Zyprexa  20 mg  Paranoia     BuSpar  20 mg  Anxiety    To add the table of Psych Meds to a note, type .PSYCHMEDHISTORY   Substance Use History:    Include pertinent details, such as age at first use, amount and method of use, negative consequences  of use, any complicated withdrawal, and treatment details (detox, residential, MAT) Current substances used: History of alcohol use, cocaine, cannabis, nicotine.  Last use of all the substances was 3 weeks ago and unable to recall the quantity.   Did not report any history of withdrawal seizures, DTs Previously used substances: As reported above    Psychosocial History   Developmental: Normal Education: High school Employment/source of income: Currently unemployed, applying for disability Living situation: Currently homeless Primary supports: Has mom and sister but currently not involved in his life Trauma history: Loss of multiple family members Baseline functional status: Independent Legal issues: Has felony charges but is not comfortable in giving details Hobbies, leisure, recreation: Sales promotion account executive Religious/spiritual values: None reported Ethnic/cultural considerations: None reported Hotel manager service: None reported      Past Medical and Surgical Histories: he has no past medical history on file. he has no past surgical history on file. There are no concerns about hearing or vision. There is no history of head trauma, seizures, or loss of consciousness. Current medication list and administration record reviewed. he has No Known Allergies. PCP on file: Provider   Family History:  Psychiatric: diagnoses of depression in brother(s) his family history is not on file.  Objective PATIENT SUMMARY <redacted file path>  SYNOPSIS <redacted file path>  RESULTS REVIEW <redacted file path>  EVENT LOG <redacted file path>  REVIEW FLOWSHEETS <redacted file path> Temp:  [36.4 C (97.5 F)] 36.4 C (97.5 F) Heart Rate:  [105] 105 Resp:  [18] 18 BP: (156)/(95) 156/95 Physical Exam-reviewed and agree with the physical findings of the examination conducted by the ED medical provider       Psychometrics:     Labs: Chemistry: Recent Labs  Lab 01/20/24 1140  NA 139  K 4.1  CL 108  CO2 24  BUN 11  CREATININE 1.1  GLUCOSE 100  CALCIUM 8.5*   CBC: Recent Labs  Lab 01/20/24 1140  WBC 8.3  HGB 14.0  HCT 42.5  MCV 95  PLT 219  NEUTCT 6.1    EKG:No results for input(s): VENTRATE, PRINTERVAL, QRSINTERVAL,  QTINTERVAL, QTC in the last 168 hours. Urinalysis:No results for input(s): COLORU, CLARITYU, SPECGRAV, LABPH, PROTEINUA, GLUCOSEU, KETONESU, BLOODU, NITRITE, LEUKOCYTESUR, BILIRUBINUR, UROBILINOGEN in the last 168 hours.   Endocrine: Recent Labs  Lab 01/20/24 1143  POCGLU 92   Pregnancy:No results found for: HCGQUAL, POCHCG Liver function: Recent Labs  Lab 01/20/24 1140  AST 24  ALT 39  TOTALPROTEIN 6.7  ALB 3.3*  TBILI 0.2*  ALKPHOS 64     Toxicology Screen:  Recent Labs    01/20/24 1513  AMPHETUR Negative  BARBURINE Negative  BENZOUR Negative  COCAINESCRN Negative  OPIATESUR Negative  THCURINE Positive*   Coronavirus (COVID-19) SARS-CoV-2 Rapid Test  Date Value Ref Range Status  01/20/2024 Not Detected Not Detected Final     Safety assessment:  Suicide screening: Score: No risk (01/20/24 1118)   Suicide Risk Assessment:  Risk factors: Lack of family or community supports Lack of employment or other meaningful activities    Risk for harm to others include: Lack of family or community supports Male gender Recent trauma or loss  Protective factors include Lack of current suicidality Lack of current homicidality No history of previous suicide attempts Expresses purposes for living and/or plans for the future Expresses motivation and hope for treatment Expresses understanding of the risks and consequences of SI or HI No known access to firearms, weapons, or other means Utilizes positive coping  skills Current treatment adherence     Coping strategies and calming techniques include: quiet reflection, listening to music, mindfulness, and calm self-talk  Risk level/Interventions: Given the patient's presentation at the time of this assessment and considering the above noted risk and protective factors, in my clinical judgment the patient's acute risk potential for suicidal or homicidal behavior is: Low acute risk: they have modifiable  risk factors and strong protective factors, no suicidal plan, intent, or behavior; and they can maintain their safety in the community.  Actions taken at this time to mitigate risk include: -Provided with education regarding suicide risk and protective factors, warning signs of suicidal behavior, and Instructions on how to access crisis/emergency services, as well as following recommendations for treatment of psychiatric illness and abstaining from substance abuse.  Safety Plan: N/A - no safety concerns to warrant safety plan  Assessment   Antonios is a 48 y.o. male with a history of depression anxiety, reported schizophrenia, polysubstance use presented to the emergency room for psych evaluation and concerns about psychiatric medications.  On assessment patient reports feeling depressed but denies any active suicidal/homicidal ideation/intent/plan.  He denies auditory/visual hallucinations and is not displaying any overt delusions.  He is not displaying any grandiose delusions.  He did acknowledge his history of drug use but denies any drug or alcohol use in the last few weeks.  UDS was not done during this ED visit.  He is requesting for prescription of his medications and referral for long-term substance use rehab.  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  Even though it is unclear if he is actively using any drugs currently given UDS was not checked during this visit, since it is less than 3 months substance-induced psychotic disorder/mood disorder still is considered.  He also endorses symptoms of depression and meets criteria for MDD, moderate.  Given his persistent paranoia it will be better to rule out schizoaffective disorder.    Diagnoses:  Substance induced psychotic disorder Schizophrenia Vs Schizoaffective disorder Vs MDD with psychosis    Plan   Disposition: Psychiatrically stable for discharge.  Safety:  Physician  Hold/IVC: Neither Physician Hold nor IVC is indicated. Suicide precautions: no Observation Status: no need continuous visual observation or suicide precautions  Meds: -Given prescriptions for Lexapro  15 mg daily, BuSpar  10 mg twice daily, Zyprexa  5 mg in the morning and 50 mg nightly.  LCSW added the information on long-term substance use rehabs   Inpatient Follow-up:  Outpatient Follow-Up: Behavioral Health CM has provided the patient with community resources, has documented these resources in their note, and has placed this information in the patient's AVS  Thank you for allowing psychiatry to be involved in this patient's care. Please call [phone] or [phone] for questions [facility] ED Psychiatry team).     Attestation Statement:   I personally performed the service. (TP)  SREE LATHA KRIS JADAPALLE, MD

## 2024-01-20 NOTE — ED Triage Notes (Signed)
 Pt ambulated to treatment room for psych eval. Pt states he wants to be put back on psych mends as he is concerned his mental health is worsening. Pt states he would like to speak to a psychiatrist. Pt denies current SI/HI.   No past medical history on file.

## 2024-03-14 ENCOUNTER — Emergency Department
Admission: EM | Admit: 2024-03-14 | Discharge: 2024-03-15 | Disposition: A | Discharge status: Home / Self Care | Attending: Emergency Medicine | Admitting: Emergency Medicine

## 2024-03-14 ENCOUNTER — Other Ambulatory Visit: Payer: Self-pay

## 2024-03-14 DIAGNOSIS — R45850 Homicidal ideations: Secondary | ICD-10-CM

## 2024-03-14 DIAGNOSIS — F250 Schizoaffective disorder, bipolar type: Secondary | ICD-10-CM | POA: Diagnosis present

## 2024-03-14 DIAGNOSIS — F1920 Other psychoactive substance dependence, uncomplicated: Secondary | ICD-10-CM | POA: Diagnosis not present

## 2024-03-14 DIAGNOSIS — F32A Depression, unspecified: Secondary | ICD-10-CM | POA: Diagnosis present

## 2024-03-14 DIAGNOSIS — R45851 Suicidal ideations: Secondary | ICD-10-CM

## 2024-03-14 DIAGNOSIS — F1990 Other psychoactive substance use, unspecified, uncomplicated: Secondary | ICD-10-CM | POA: Diagnosis not present

## 2024-03-14 LAB — ACETAMINOPHEN LEVEL: Acetaminophen (Tylenol), Serum: <10 ug/mL — ABNORMAL LOW (ref 10–30)

## 2024-03-14 LAB — COMPREHENSIVE METABOLIC PANEL WITH GFR
ALT: 19 U/L (ref 0–44)
AST: 18 U/L (ref 15–41)
Albumin: 3.9 g/dL (ref 3.5–5.0)
Alkaline Phosphatase: 74 U/L (ref 38–126)
Anion gap: 7 (ref 5–15)
BUN: 10 mg/dL (ref 6–20)
CO2: 26 mmol/L (ref 22–32)
Calcium: 9 mg/dL (ref 8.9–10.3)
Chloride: 106 mmol/L (ref 98–111)
Creatinine, Ser: 0.95 mg/dL (ref 0.61–1.24)
GFR, Estimated: >60 mL/min (ref >60)
Glucose, Bld: 110 mg/dL — ABNORMAL HIGH (ref 70–99)
Potassium: 4 mmol/L (ref 3.5–5.1)
Sodium: 139 mmol/L (ref 135–145)
Total Bilirubin: 0.4 mg/dL (ref 0.0–1.2)
Total Protein: 7.9 g/dL (ref 6.5–8.1)

## 2024-03-14 LAB — CBC
HCT: 42.4 % (ref 39.0–52.0)
Hemoglobin: 14.4 g/dL (ref 13.0–17.0)
MCH: 32.4 pg (ref 26.0–34.0)
MCHC: 34 g/dL (ref 30.0–36.0)
MCV: 95.5 fL (ref 80.0–100.0)
Platelets: 236 10*3/uL (ref 150–400)
RBC: 4.44 MIL/uL (ref 4.22–5.81)
RDW: 13.3 % (ref 11.5–15.5)
WBC: 7.3 10*3/uL (ref 4.0–10.5)
nRBC: 0 % (ref 0.0–0.2)

## 2024-03-14 LAB — SALICYLATE LEVEL: Salicylate Lvl: <7 mg/dL — ABNORMAL LOW (ref 7.0–30.0)

## 2024-03-14 LAB — ETHANOL: Alcohol, Ethyl (B): <10 mg/dL (ref <10)

## 2024-03-14 MED ORDER — LORAZEPAM 2 MG PO TABS
0.0000 mg | ORAL_TABLET | Freq: Four times a day (QID) | ORAL | Status: DC
Start: 2024-03-14 — End: 2024-03-16

## 2024-03-14 MED ORDER — LORAZEPAM 2 MG PO TABS: 0.0000 mg | ORAL_TABLET | Freq: Two times a day (BID) | ORAL | Status: DC

## 2024-03-14 MED ORDER — LORAZEPAM 2 MG/ML IJ SOLN: 0.0000 mg | Freq: Two times a day (BID) | INTRAMUSCULAR | Status: DC

## 2024-03-14 MED ORDER — THIAMINE MONONITRATE 100 MG PO TABS: 100.0000 mg | ORAL_TABLET | Freq: Every day | ORAL | Status: DC

## 2024-03-14 MED ORDER — THIAMINE HCL 100 MG/ML IJ SOLN
100.0000 mg | Freq: Every day | INTRAMUSCULAR | Status: DC
  Filled 2024-03-14: qty 1

## 2024-03-14 MED ORDER — THIAMINE HCL 100 MG/ML IJ SOLN: 100.0000 mg | Freq: Every day | INTRAMUSCULAR | Status: DC

## 2024-03-14 MED ORDER — THIAMINE MONONITRATE 100 MG PO TABS
100.0000 mg | ORAL_TABLET | Freq: Every day | ORAL | Status: DC
  Administered 2024-03-15: 100 mg via ORAL
  Filled 2024-03-14: qty 1

## 2024-03-14 MED ORDER — LORAZEPAM 2 MG/ML IJ SOLN: 0.0000 mg | Freq: Four times a day (QID) | INTRAMUSCULAR | Status: DC

## 2024-03-14 NOTE — BH Assessment (Signed)
 Comprehensive Clinical Assessment (CCA) Screening, Triage and Referral Note  03/14/2024 Justin Neal [id number] Recommendations for Services/Supports/Treatments: Disposition pending. Justin Neal is a 48 year old, Black race, Not Hispanic or Latino ethnicity, ENGLISH speaking male. Pt is Voluntary. Per triage note: Pt here for mental health eval, pt states he is trying to get "his stuff right". Pt states he was last seen at [facility] for about 1 month. Pt denies SI/HI, pt is homeless.  On assessment, the patient was expansive about his reasons for presenting to the hospital. Pt explained that his main issue was society not helping him and having no access to his psych medications. Pt also reported that he is homeless, has no family, and no resources. Of note, pt mentioned later in the assessment that he does stay with his mother at times. Pt requested any long-term mental health treatment available and help getting back on his medications. Pt expressed passive SI/HI; however, he did not have any intention or plan. Pt admitted to using beer, cannabis, and cocaine, daily. Pt's last use was 03/13/24. Pt explained that he's had multiple hospitalizations; however, none of them led to his medications being sent to a pharmacy. Pt had poor insight, explaining that his willingness to stop using substances is contingent on him getting social assistance. Pt is not connected to any services at this time. Pt had tangential, pressured speech. Pt's thoughts were intact and relevant. Pt was seemingly paranoid, explaining that he knows that there is a new world order and that "they" are all out to get him. Pt was oriented x4. Pt presented with an anxious mood; affect was labile. The patient denied current SI,  or AV/H. Pt reported that he did wake up this morning feeling like he doesn't want to be here. Pt also eluded to wanting to hurt people; but does not give any specific person.  Chief Complaint:  Chief Complaint  Patient  presents with   Mental Health Problem   Visit Diagnosis: Schizophrenia [facility])   Patient Reported Information How did you hear about [place]? No data recorded What Is the Reason for Your Visit/Call Today? No data recorded How Long Has This Been Causing You Problems? No data recorded What Do You Feel Would Help You the Most Today? No data recorded  Have You Recently Had Any Thoughts About Hurting Yourself? No data recorded Are You Planning to Commit Suicide/Harm Yourself At This time? No data recorded  Have you Recently Had Thoughts About Hurting Someone [name]? No data recorded Are You Planning to Harm Someone at This Time? No data recorded Explanation: No data recorded  Have You Used Any Alcohol or Drugs in the Past 24 Hours? No data recorded How Long Ago Did You Use Drugs or Alcohol? No data recorded What Did You Use and How Much? No data recorded  Do You Currently Have a Therapist/Psychiatrist? No data recorded Name of Therapist/Psychiatrist: No data recorded  Have You Been Recently Discharged From Any Office Practice or Programs? No data recorded Explanation of Discharge From Practice/Program: No data recorded   CCA Screening Triage Referral Assessment Type of Contact: No data recorded Telemedicine Service Delivery:   Is this Initial or Reassessment?   Date Telepsych consult ordered in [facility]:    Time Telepsych consult ordered in [facility]:    Location of Assessment: No data recorded Provider Location: No data recorded   Collateral Involvement: No data recorded  Does Patient Have a Court Appointed Legal Guardian? No data recorded Name and Contact of Legal  Guardian: No data recorded If Minor and Not Living with Parent(s), Who has Custody? No data recorded Is CPS involved or ever been involved? No data recorded Is APS involved or ever been involved? No data recorded  Patient Determined To Be At Risk for Harm To Self or Others Based on Review of Patient Reported Information or Presenting  Complaint? No data recorded Method: No data recorded Availability of Means: No data recorded Intent: No data recorded Notification Required: No data recorded Additional Information for Danger to Others Potential: No data recorded Additional Comments for Danger to Others Potential: No data recorded Are There Guns or Other Weapons in Your Home? No data recorded Types of Guns/Weapons: No data recorded Are These Weapons Safely Secured?                            No data recorded Who Could Verify You Are Able To Have These Secured: No data recorded Do You Have any Outstanding Charges, Pending Court Dates, Parole/Probation? No data recorded Contacted To Inform of Risk of Harm To Self or Others: No data recorded  Does Patient Present under Involuntary Commitment? No data recorded   [place] of Residence: No data recorded  Patient Currently Receiving the Following Services: No data recorded  Determination of Need: No data recorded  Options For Referral: No data recorded  Disposition Recommendation per psychiatric provider: Pending  Justin Neal R Jonea Bukowski, LCAS

## 2024-03-14 NOTE — Consult Note (Incomplete)
 [facility] Consult Note  Patient Name: Justin Neal MRN: [id number] DOB: Aug 05, 1976 DATE OF Consult: 03/14/2024  PRIMARY PSYCHIATRIC DIAGNOSES  1.  *** 2.  *** 3.  ***  RECOMMENDATIONS  Inpt psych admission recommended:    [x] YES       []  NO   If yes:       [x]   Pt meets involuntary commitment criteria if not voluntary       []    Pt does not meet involuntary commitment criteria and must be         voluntary. If patient is not voluntary, then discharge is recommended.  inpatient admission due to his mania/paranoid presentation, impulsivity and volatile behavior and the fact that he is communicating suicidal/homicidal thoughts.    Medication recommendations:   Non-Medication recommendations:    Communication: Treatment team members (and family members if applicable) who were involved in treatment/care discussions and planning, and with whom we spoke or engaged with via secure text/chat, include the following: ***   I have discussed my assessment and treatment recommendations with the patient. Possible medication side effects/risks/benefits of current regimen.   Importance of medication adherence for medication to be beneficial.   Follow-Up Telepsychiatry C/L services:            []  We will continue to follow this patient with you.             [x]  Will sign off for now. Please re-consult our service as necessary.  Thank you for involving [place] in the care of this patient. If you have any additional questions or concerns, please call [phone] and ask for me or the provider on-call.  TELEPSYCHIATRY ATTESTATION & CONSENT  As the provider for this telehealth consult, I attest that I verified the patient's identity using two separate identifiers, introduced myself to the patient, provided my credentials, disclosed my location, and performed this encounter via a HIPAA-compliant, real-time, face-to-face, two-way, interactive audio and video platform and with the full consent and  agreement of the patient (or guardian as applicable.)  Patient physical location: [facility]. Telehealth provider physical location: home office in state of [place]  Video start time: 22:19 pm (Central Time) Video end time: 22:51 pm (Central Time)  IDENTIFYING DATA  Justin Neal is a 48 y.o. year-old male for whom a psychiatric consultation has been ordered by the primary provider. The patient was identified using two separate identifiers.  CHIEF COMPLAINT/REASON FOR CONSULT  "Been going through life, trying to get checked in and get my mental health right, I am supposed to be on medication, feeling stressed out"  HISTORY OF PRESENT ILLNESS (HPI)  The patient  presents to ED for re-establishment of his previous medication psychotropic medication, he is help seeking, presents with mania episode.   Hx of treatment for  Schizoaffective disorder; polysub dependence Currently prescribed: buspirone, escitalopram, olanzapine, noncompliant, reports was never provided the medication; last reported taking medication one month ago  He is religious preoccupied, he talks about [place]; slavery and being "beaten" down "in this town"; talks about abuse from police and society; He is irritable, paranoid, tangential, pressured; states increased stress "I hope you burn in hell, what am I some sacrifice for some, I won't stand for this anymore, I will kill them, the whole town is involved in this shit, I don't want to be involved in this world, I don't care if I die"  "I woke up crying today, so damn angry, I don't have nothing,  shelter messes with you, I don't want to be here, people taken the will power out of me to live, I want everyone in that group's head on a stick"  Today, client with symptoms of depression with anergia, anhedonia, amotivation,  anxiety, frequent worry, feeling restlessness, no reported panic symptoms, no reported obsessive/compulsive behaviors.  There is evidence of  psychosis/delusional thinking.  Client with episodes of hypomania, hyperactivity, involvement in dangerous activities, self-inflated ego, grandiosity,  sleeping 3-4hrs/24hrs, appetite decreased concentration   Reviewed active medication list/reviewed labs. Obtained Collateral information from medical record.  When asked about [facility] referral from Feb encounter, he reports he cannot live in a place for 2 years where everyone else is controlling his life  PAST PSYCHIATRIC HISTORY    Previous Psychiatric Hospitalizations: several; Recently at [facility] 12/2023 for approx one month Previous Detox/Residential treatments: several Outpt treatment:  none compliant Previous psychotropic medication trials:  Previous mental health diagnosis per client/MEDICAL RECORD NUMBERAnxiety, depression,PTSD, polysubstnace use, schizophrenia   Suicide attempts/self-injurious behaviors:  denied history of suicidal/homicidal ideation/gestures; denied history of self-harm behaviors     PAST MEDICAL HISTORY  Past Medical History:  Diagnosis Date  . Schizophrenia (HCC)      HOME MEDICATIONS  busPIRone 10 MG tablet; Commonly known as: BUSPAR; Take 1 tablet (10 mg  total) by mouth 2 (two) times daily for 15 days escitalopram oxalate 10 MG tablet; Commonly known as: LEXAPRO; Take 1.5  tablets (15 mg total) by mouth once daily for 15 days OLANZapine 10 MG tablet; Commonly known as: ZyPREXA; Take 1.5 tablets  (15 mg total) by mouth at bedtime for 15 days    ALLERGIES  No Known Allergies  SOCIAL & SUBSTANCE USE HISTORY  Has mom and sister but currently not involved in his life  Living Situation: homeless for past 5-6 yrs                unemployed Education: HS grad has current legal issues- reportedly has some drug charges; per record review has hx of  prison for assault charges, assault with a deadly weapon    Social Drivers of Health Y/N   Financial Resource Strain: Y  Food Insecurity: Y  Transportation Needs: Y   Physical Activity: N  Stress: Y  Social Connections: Y  Intimate Partner Violence: N  Housing Stability: Y      Have you used/abused any of the following (include frequency/amt/last use):   BAL <10; no UDS results available during this encounter, reportedly last cocaine and cannabis use was yesterday       FAMILY HISTORY   Family Psychiatric History (if known):  unknown at this time; per medical record hx of depression  MENTAL STATUS EXAM (MSE)  Mental Status Exam: Neal Appearance: Casual  Orientation:  Full (Time, Place, and Person)  Memory:  Immediate;   Good Recent;   Good Remote;   Good  Concentration:  Concentration: Fair  Recall:  Fair  Attention  Fair  Eye Contact:   intense stare  Speech:  Pressured  Language:  Good  Volume:  Increased  Mood: irritable  Affect:  Labile  Thought Process:  Disorganized, Irrelevant, and Descriptions of Associations: Tangential  Thought Content:  paranoid, tangential  Suicidal Thoughts:  Yes.  without intent/plan  Homicidal Thoughts:  Yes.  without intent/plan  Judgement:  Impaired  Insight:  Lacking  Psychomotor Activity:  Increased  Akathisia:  No  Fund of Knowledge:  Fair    Assets:  Desire for Improvement Physical Health  Cognition:  WNL  ADL's:  Intact  AIMS (if indicated):       VITALS  Blood pressure 116/84, pulse 62, temperature 98 F (36.7 C), temperature source Oral, resp. rate 17, height 5\' 11"  (1.803 m), weight 93 kg, SpO2 97%.  LABS  Admission on 03/14/2024  Component Date Value Ref Range Status  . Sodium 03/14/2024 139  135 - 145 mmol/L Final  . Potassium 03/14/2024 4.0  3.5 - 5.1 mmol/L Final  . Chloride 03/14/2024 106  98 - 111 mmol/L Final  . CO2 03/14/2024 26  22 - 32 mmol/L Final  . Glucose, Bld 03/14/2024 110 (H)  70 - 99 mg/dL Final   Glucose reference range applies only to samples taken after fasting for at least 8 hours.  . BUN 03/14/2024 10  6 - 20 mg/dL Final  . Creatinine, Ser 03/14/2024  0.95  0.61 - 1.24 mg/dL Final  . Calcium 16/08/9603 9.0  8.9 - 10.3 mg/dL Final  . Total Protein 03/14/2024 7.9  6.5 - 8.1 g/dL Final  . Albumin 54/07/8118 3.9  3.5 - 5.0 g/dL Final  . AST 14/78/2956 18  15 - 41 U/L Final  . ALT 03/14/2024 19  0 - 44 U/L Final  . Alkaline Phosphatase 03/14/2024 74  38 - 126 U/L Final  . Total Bilirubin 03/14/2024 0.4  0.0 - 1.2 mg/dL Final  . GFR, Estimated 03/14/2024 >60  >60 mL/min Final   Comment: (NOTE) Calculated using the CKD-EPI Creatinine Equation (2021)   . Anion gap 03/14/2024 7  5 - 15 Final   Performed at [facility], [address] [place] [place], [place] [address]  . Alcohol, Ethyl (B) 03/14/2024 <10  <10 mg/dL Final   Comment: (NOTE) For medical purposes only. Performed at [facility], [address] [place], [place] [address]   . Salicylate Lvl 03/14/2024 <7.0 (L)  7.0 - 30.0 mg/dL Final   Performed at [facility], [address] [place] [place], [place] [address]  . Acetaminophen (Tylenol), Serum 03/14/2024 <10 (L)  10 - 30 ug/mL Final   Comment: (NOTE) Therapeutic concentrations vary significantly. A range of 10-30 ug/mL  may be an effective concentration for many patients. However, some  are best treated at concentrations outside of this range. Acetaminophen concentrations >150 ug/mL at 4 hours after ingestion  and >50 ug/mL at 12 hours after ingestion are often associated with  toxic reactions.  Performed at [facility], [address] [place], [place] [address]   . WBC 03/14/2024 7.3  4.0 - 10.5 K/uL Final  . RBC 03/14/2024 4.44  4.22 - 5.81 MIL/uL Final  . Hemoglobin 03/14/2024 14.4  13.0 - 17.0 g/dL Final  . HCT 32/44/0102 42.4  39.0 - 52.0 % Final  . MCV 03/14/2024 95.5  80.0 - 100.0 fL Final  . MCH 03/14/2024 32.4  26.0 - 34.0 pg Final  . MCHC 03/14/2024 34.0  30.0 - 36.0 g/dL Final  . RDW 72/53/6644 13.3  11.5 - 15.5 % Final  . Platelets 03/14/2024 236  150 - 400 K/uL Final  . nRBC  03/14/2024 0.0  0.0 - 0.2 % Final   Performed at [facility], [address] [place], [place] [address]    PSYCHIATRIC REVIEW OF SYSTEMS (ROS)  Depression:      []  Denies all symptoms of depression [x] Depressed mood       [] Insomnia/hypersomnia              [x] Fatigue        []   Change in appetite     [] Anhedonia                                [x] Difficulty concentrating      [] Hopelessness             [x] Worthlessness [] Guilt/shame                [] Psychomotor agitation/retardation   Mania:     [] Denies all symptoms of mania [x] Elevated mood           [x] Irritability         [x] Pressured speech         []  Grandiosity         [x]  Decreased need for sleep                                                 [x] Increased energy          [x]  Increase in goal directed activity                                       [x] Flight of ideas    []  Excessive involvement in high-risk behaviors                   [x]  Distractibility     Psychosis:     [] Denies all symptoms of psychosis [x] Paranoia         []  Auditory Hallucinations          [] Visual hallucinations         [] ELOC        [] IOR                [x] Delusions   Suicide:    []  Denies SI/plan/intent [x]  Passive SI         []   Active SI         [] Plan           [] Intent   Homicide:  []   Denies HI/plan/intent [x]  Passive HI         []  Active HI         [] Plan            [] Intent           [] Identified Target    Additional findings:      Musculoskeletal: No abnormal movements observed      Gait & Station: Laying/Sitting      Pain Screening: Denies      Nutrition & Dental Concerns: none reported  RISK FORMULATION/ASSESSMENT  Is the patient experiencing any suicidal or homicidal ideations: Yes       Explain if yes: patient very tangential and pressured, makes vague comments of dying and other dying, paranoid, irritable, impulsive  Protective factors considered for safety management:   Access to adequate health care Advice& help  seeking Resourcefulness/Survival skills Spirituality   Risk factors/concerns considered for safety management:  Depression Substance abuse/dependence Access to lethal means Impulsivity Aggression Barriers to accessing treatment Male gender Unmarried  Is there a safety management plan with the patient and treatment team to minimize risk factors and promote protective factors: Yes  Explain: safety obs Is crisis care placement or psychiatric hospitalization recommended: Yes     Based on my current evaluation and risk assessment, patient is determined at this time to be at:  High risk  *RISK ASSESSMENT Risk assessment is a dynamic process; it is possible that this patient's condition, and risk level, may change. This should be re-evaluated and managed over time as appropriate. Please re-consult psychiatric consult services if additional assistance is needed in terms of risk assessment and management. If your team decides to discharge this patient, please advise the patient how to best access emergency psychiatric services, or to call 911, if their condition worsens or they feel unsafe in any way.  Total time spent in this encounter was 60 minutes with greater than 50% of time spent in counseling and coordination of care.     Dr. Balinda Level, PhD, MSN, APRN, PMHNP-BC, [facility]  Ainsley Alfred, NP Tele[facility] Consult Services

## 2024-03-14 NOTE — Consult Note (Signed)
 [facility] Consult Note  Patient Name: Justin Neal MRN: [id number] DOB: 08-18-1976 DATE OF Consult: 03/14/2024  PRIMARY PSYCHIATRIC DIAGNOSES  1.  Schizoaffective D/O, bipolar type 2.  Polysub Use Disorder 3.  SI/HI  RECOMMENDATIONS  Inpt psych admission recommended:    [x] YES       []  NO   If yes:       [x]   Pt meets involuntary commitment criteria if not voluntary       []    Pt does not meet involuntary commitment criteria and must be         voluntary. If patient is not voluntary, then discharge is recommended.  inpatient admission due to his mania/paranoid presentation, impulsivity and volatile behavior and the fact that he is communicating suicidal/homicidal thoughts.    Medication recommendations:  restart previous home medications busPIRone 10 MG tablet; take 1 tablet (10 mg total) by mouth 2 (two) times daily  escitalopram oxalate 10 MG tablet;  Take 1.5 tablets (15 mg total) by mouth once daily  OLANZapine 10 MG tablet; take 1.5 tablets (15 mg total) by mouth at bedtime   PRNs haldol 5mg  po every 8 hrs as needed for agitation/aggressive behaviors haldol  2mg  IM every 8 hrs as needed for agitation/aggressive behaviors benadryl 50mg  po/IM every 6 hours as needed for agitation/aggressive behaviors/EPS Hydroxyzine 50mg  po three times daily as needed anxiety/irritability   Please ensure K> 4, Mg> 2 and Qtc < 500 when using antipsychotics. Monitor for extrapyramidal syndrome (EPS) such as dystonia, akathisia, and tardive dyskinesia  Non-Medication recommendations:  monitor CIWA; safety obs assault precautions  Communication: Treatment team members (and family members if applicable) who were involved in treatment/care discussions and planning, and with whom we spoke or engaged with via secure text/chat, include the following: epic chat Nurse Cala Bradford, Dr. Fuller Plan; Leavy Cella LCAS   I have discussed my assessment and treatment recommendations with the patient. Possible  medication side effects/risks/benefits of current regimen.   Importance of medication adherence for medication to be beneficial.   Follow-Up Telepsychiatry C/L services:            []  We will continue to follow this patient with you.             [x]  Will sign off for now. Please re-consult our service as necessary.  Thank you for involving [place] in the care of this patient. If you have any additional questions or concerns, please call [phone] and ask for me or the provider on-call.  TELEPSYCHIATRY ATTESTATION & CONSENT  As the provider for this telehealth consult, I attest that I verified the patient's identity using two separate identifiers, introduced myself to the patient, provided my credentials, disclosed my location, and performed this encounter via a HIPAA-compliant, real-time, face-to-face, two-way, interactive audio and video platform and with the full consent and agreement of the patient (or guardian as applicable.)  Patient physical location: [place] [facility]. Telehealth provider physical location: home office in state of [place]  Video start time: 22:19 pm (Central Time) Video end time: 22:51 pm (Central Time)  IDENTIFYING DATA  Justin Neal is a 48 y.o. year-old male for whom a psychiatric consultation has been ordered by the primary provider. The patient was identified using two separate identifiers.  CHIEF COMPLAINT/REASON FOR CONSULT  "Been going through life, trying to get checked in and get my mental health right, I am supposed to be on medication, feeling stressed out"  HISTORY OF PRESENT ILLNESS (HPI)  The patient  presents  to ED for re-establishment of his previous medication psychotropic medication, he is help seeking, presents with mania episode.   Hx of treatment for  Schizoaffective disorder; polysub dependence Currently prescribed: buspirone, escitalopram, olanzapine, noncompliant, reports was never provided the medication; last reported taking medication one month ago  He is  religious preoccupied, he talks about [place]; slavery and being "beaten" down "in this town"; talks about abuse from police and society; He is irritable, paranoid, tangential, pressured; states increased stress "I hope you burn in hell, what am I some sacrifice for some, I won't stand for this anymore, I will kill them, the whole town is involved in this shit, I don't want to be involved in this world, I don't care if I die"  "I woke up crying today, so damn angry, I don't have nothing, shelter messes with you, I don't want to be here, people taken the will power out of me to live, I want everyone in that group's head on a stick"  Today, client with symptoms of depression with anergia, anhedonia, amotivation,  anxiety, frequent worry, feeling restlessness, no reported panic symptoms, no reported obsessive/compulsive behaviors.  There is evidence of psychosis/delusional thinking.  Client with episodes of hypomania, hyperactivity, involvement in dangerous activities, self-inflated ego, grandiosity,  sleeping 3-4hrs/24hrs, appetite decreased concentration   Reviewed active medication list/reviewed labs. Obtained Collateral information from medical record.  When asked about [facility] referral from Feb encounter, he reports he cannot live in a place for 2 years where everyone else is controlling his life  PAST PSYCHIATRIC HISTORY    Previous Psychiatric Hospitalizations: several; Recently at [facility] 12/2023 for approx one month Previous Detox/Residential treatments: several Outpt treatment:  none compliant Previous psychotropic medication trials:  Previous mental health diagnosis per client/MEDICAL RECORD NUMBERAnxiety, depression,PTSD, polysubstnace use, schizophrenia   Suicide attempts/self-injurious behaviors:  denied history of suicidal/homicidal ideation/gestures; denied history of self-harm behaviors     PAST MEDICAL HISTORY  Past Medical History:  Diagnosis Date   Schizophrenia (HCC)      HOME  MEDICATIONS  busPIRone 10 MG tablet; take 1 tablet (10 mg total) by mouth 2 (two) times daily  escitalopram oxalate 10 MG tablet;  Take 1.5 tablets (15 mg total) by mouth once daily  OLANZapine 10 MG tablet; take 1.5 tablets (15 mg total) by mouth at bedtime    ALLERGIES  No Known Allergies  SOCIAL & SUBSTANCE USE HISTORY  Has mom and sister in [place] Living Situation: homeless for past 5-6 yrs                unemployed Education: HS grad has current legal issues- reportedly has some drug charges; per record review has hx of  prison for assault charges, assault with a deadly weapon    Social Drivers of Health Y/N   Financial Resource Strain: Y  Food Insecurity: Y  Transportation Needs: Y  Physical Activity: N  Stress: Y  Social Connections: Y  Intimate Partner Violence: N  Housing Stability: Y      Have you used/abused any of the following (include frequency/amt/last use):   BAL <10; no UDS results available during this encounter, reportedly last cocaine and cannabis use was yesterday       FAMILY HISTORY   Family Psychiatric History (if known):  unknown at this time; per medical record hx of depression  MENTAL STATUS EXAM (MSE)  Mental Status Exam: General Appearance: Casual  Orientation:  Full (Time, Place, and Person)  Memory:  Immediate;  Good Recent;   Good Remote;   Good  Concentration:  Concentration: Fair  Recall:  Fair  Attention  Fair  Eye Contact:   intense stare  Speech:  Pressured  Language:  Good  Volume:  Increased  Mood: irritable  Affect:  Labile  Thought Process:  Disorganized, Irrelevant, and Descriptions of Associations: Tangential  Thought Content:  paranoid, tangential  Suicidal Thoughts:  Yes.  without intent/plan  Homicidal Thoughts:  Yes.  without intent/plan  Judgement:  Impaired  Insight:  Lacking  Psychomotor Activity:  Increased  Akathisia:  No  Fund of Knowledge:  Fair    Assets:  Desire for Improvement Physical Health   Cognition:  WNL  ADL's:  Intact  AIMS (if indicated):       VITALS  Blood pressure 116/84, pulse 62, temperature 98 F (36.7 C), temperature source Oral, resp. rate 17, height 5\' 11"  (1.803 m), weight 93 kg, SpO2 97%.  LABS  Admission on 03/14/2024  Component Date Value Ref Range Status   Sodium 03/14/2024 139  135 - 145 mmol/L Final   Potassium 03/14/2024 4.0  3.5 - 5.1 mmol/L Final   Chloride 03/14/2024 106  98 - 111 mmol/L Final   CO2 03/14/2024 26  22 - 32 mmol/L Final   Glucose, Bld 03/14/2024 110 (H)  70 - 99 mg/dL Final   Glucose reference range applies only to samples taken after fasting for at least 8 hours.   BUN 03/14/2024 10  6 - 20 mg/dL Final   Creatinine, Ser 03/14/2024 0.95  0.61 - 1.24 mg/dL Final   Calcium 16/08/9603 9.0  8.9 - 10.3 mg/dL Final   Total Protein 54/07/8118 7.9  6.5 - 8.1 g/dL Final   Albumin 14/78/2956 3.9  3.5 - 5.0 g/dL Final   AST 21/30/8657 18  15 - 41 U/L Final   ALT 03/14/2024 19  0 - 44 U/L Final   Alkaline Phosphatase 03/14/2024 74  38 - 126 U/L Final   Total Bilirubin 03/14/2024 0.4  0.0 - 1.2 mg/dL Final   GFR, Estimated 03/14/2024 >60  >60 mL/min Final   Comment: (NOTE) Calculated using the CKD-EPI Creatinine Equation (2021)    Anion gap 03/14/2024 7  5 - 15 Final   Performed at [facility], [address] [place], [place] [address]   Alcohol, Ethyl (B) 03/14/2024 <10  <10 mg/dL Final   Comment: (NOTE) For medical purposes only. Performed at [facility], [address] [place], [place] [address]    Salicylate Lvl 03/14/2024 <7.0 (L)  7.0 - 30.0 mg/dL Final   Performed at [facility], [address] [place], [place] [address]   Acetaminophen (Tylenol), Serum 03/14/2024 <10 (L)  10 - 30 ug/mL Final   Comment: (NOTE) Therapeutic concentrations vary significantly. A range of 10-30 ug/mL  may be an effective concentration for many patients. However, some  are best treated at concentrations outside  of this range. Acetaminophen concentrations >150 ug/mL at 4 hours after ingestion  and >50 ug/mL at 12 hours after ingestion are often associated with  toxic reactions.  Performed at [facility], [address] [place], [place] [address]    WBC 03/14/2024 7.3  4.0 - 10.5 K/uL Final   RBC 03/14/2024 4.44  4.22 - 5.81 MIL/uL Final   Hemoglobin 03/14/2024 14.4  13.0 - 17.0 g/dL Final   HCT 72/53/6644 42.4  39.0 - 52.0 % Final   MCV 03/14/2024 95.5  80.0 - 100.0 fL Final  MCH 03/14/2024 32.4  26.0 - 34.0 pg Final   MCHC 03/14/2024 34.0  30.0 - 36.0 g/dL Final   RDW 96/29/5284 13.3  11.5 - 15.5 % Final   Platelets 03/14/2024 236  150 - 400 K/uL Final   nRBC 03/14/2024 0.0  0.0 - 0.2 % Final   Performed at [facility], [address] [place], [place] [address]    PSYCHIATRIC REVIEW OF SYSTEMS (ROS)  Depression:      []  Denies all symptoms of depression [x] Depressed mood       [] Insomnia/hypersomnia              [x] Fatigue        [] Change in appetite     [] Anhedonia                                [x] Difficulty concentrating      [] Hopelessness             [x] Worthlessness [] Guilt/shame                [] Psychomotor agitation/retardation   Mania:     [] Denies all symptoms of mania [x] Elevated mood           [x] Irritability         [x] Pressured speech         []  Grandiosity         [x]  Decreased need for sleep                                                 [x] Increased energy          [x]  Increase in goal directed activity                                       [x] Flight of ideas    []  Excessive involvement in high-risk behaviors                   [x]  Distractibility     Psychosis:     [] Denies all symptoms of psychosis [x] Paranoia         []  Auditory Hallucinations          [] Visual hallucinations         [] ELOC        [] IOR                [x] Delusions   Suicide:    []  Denies SI/plan/intent [x]  Passive SI         []   Active SI         [] Plan           [] Intent    Homicide:  []   Denies HI/plan/intent [x]  Passive HI         []  Active HI         [] Plan            [] Intent           [] Identified Target    Additional findings:      Musculoskeletal: No abnormal movements observed      Gait & Station: Laying/Sitting      Pain Screening: Denies      Nutrition & Dental Concerns:  none reported  RISK FORMULATION/ASSESSMENT  Is the patient experiencing any suicidal or homicidal ideations: Yes       Explain if yes: patient very tangential and pressured, makes vague comments of dying and other dying, paranoid, irritable, impulsive  Protective factors considered for safety management:   Access to adequate health care Advice& help seeking Resourcefulness/Survival skills Spirituality   Risk factors/concerns considered for safety management:  Depression Substance abuse/dependence Access to lethal means Impulsivity Aggression Barriers to accessing treatment Male gender Unmarried  Is there a safety management plan with the patient and treatment team to minimize risk factors and promote protective factors: Yes           Explain: safety obs Is crisis care placement or psychiatric hospitalization recommended: Yes     Based on my current evaluation and risk assessment, patient is determined at this time to be at:  High risk  *RISK ASSESSMENT Risk assessment is a dynamic process; it is possible that this patient's condition, and risk level, may change. This should be re-evaluated and managed over time as appropriate. Please re-consult psychiatric consult services if additional assistance is needed in terms of risk assessment and management. If your team decides to discharge this patient, please advise the patient how to best access emergency psychiatric services, or to call 911, if their condition worsens or they feel unsafe in any way.  Total time spent in this encounter was 60 minutes with greater than 50% of time spent in counseling and coordination of  care.     Dr. Sherlyn Ditto, PhD, MSN, APRN, PMHNP-BC, MCJ Laylia Mui  Ainsley Alfred, NP Telepsychiatry Consult Services

## 2024-03-14 NOTE — ED Notes (Signed)
 Pt escorted to room for TTS Consult/Interview.

## 2024-03-14 NOTE — ED Provider Notes (Signed)
 [facility] Provider Note    Event Date/Time   First MD Initiated Contact with Patient 03/14/24 1712     (approximate)   History   Mental Health Problem   HPI  Justin Neal is a 48 y.o. male who comes in for mental evaluation.  Patient reports being seen at [facility] for about a month.  He denies any SI or HI.  He does report alcohol, cocaine abuse.  Patient reports that he feels that he is having a mental health crisis.  He has required admission previously he states that he feels that he requires admission today therefore he presents will need to discuss with psychiatry.  He just reports feeling really depressed.  Patient does report that he is drinking alcohol daily but denies any history of withdrawal.   Physical Exam   Triage Vital Signs: ED Triage Vitals [03/14/24 1702]  Encounter Vitals Group     BP (!) 133/92     Systolic BP Percentile      Diastolic BP Percentile      Pulse Rate 90     Resp 18     Temp 98 F (36.7 C)     Temp Source Oral     SpO2 100 %     Weight 205 lb (93 kg)     Height 5\' 11"  (1.803 m)     Head Circumference      Peak Flow      Pain Score 0     Pain Loc      Pain Education      Exclude from Growth Chart     Most recent vital signs: Vitals:   03/14/24 1702  BP: (!) 133/92  Pulse: 90  Resp: 18  Temp: 98 F (36.7 C)  SpO2: 100%     General: Awake, no distress.  CV:  Good peripheral perfusion.  Resp:  Normal effort.  Abd:  No distention.  Other:  Patient reports feeling depressed, is tearful, denies any SI or HI   ED Results / Procedures / Treatments   Labs (all labs ordered are listed, but only abnormal results are displayed) Labs Reviewed  COMPREHENSIVE METABOLIC PANEL WITH GFR  ETHANOL  SALICYLATE LEVEL  ACETAMINOPHEN LEVEL  CBC  URINE DRUG SCREEN, QUALITATIVE ([facility] ONLY)     PROCEDURES:  Critical Care performed: No  Procedures   MEDICATIONS ORDERED IN ED: Medications  LORazepam  (ATIVAN) injection 0-4 mg (has no administration in time range)    Or  LORazepam (ATIVAN) tablet 0-4 mg (has no administration in time range)  LORazepam (ATIVAN) injection 0-4 mg (has no administration in time range)    Or  LORazepam (ATIVAN) tablet 0-4 mg (has no administration in time range)  thiamine (VITAMIN B1) tablet 100 mg (has no administration in time range)    Or  thiamine (VITAMIN B1) injection 100 mg (has no administration in time range)     IMPRESSION / MDM / ASSESSMENT AND PLAN / ED COURSE  I reviewed the triage vital signs and the nursing notes.   Patient's presentation is most consistent with acute presentation with potential threat to life or bodily function.   Pt is without any acute medical complaints. No exam findings to suggest medical cause of current presentation. Will order psychiatric screening labs and discuss further w/ psychiatric service.  D/d includes but is not limited to psychiatric disease, behavioral/personality disorder, inadequate socioeconomic support, medical.  Based on HPI, exam, unremarkable labs, no concern for  acute medical problem at this time. No rigidity, clonus, hyperthermia, focal neurologic deficit, diaphoresis, tachycardia, meningismus, ataxia, gait abnormality or other finding to suggest this visit represents a non-psychiatric problem. Screening labs reviewed.    Given this, pt medically cleared, to be dispositioned per Psych.    The patient has been placed in psychiatric observation due to the need to provide a safe environment for the patient while obtaining psychiatric consultation and evaluation, as well as ongoing medical and medication management to treat the patient's condition.  The patient has not been placed under full IVC at this time.      FINAL CLINICAL IMPRESSION(S) / ED DIAGNOSES   Final diagnoses:  Depression, unspecified depression type     Rx / DC Orders   ED Discharge Orders     None        Note:   This document was prepared using Dragon voice recognition software and may include unintentional dictation errors.   Lubertha Rush, MD 03/14/24 [phone]

## 2024-03-14 NOTE — ED Triage Notes (Addendum)
 Pt here for mental health eval, pt states he is trying to get "his stuff right". Pt states he was last seen at [facility] for about 1 month. Pt denies SI/HI, pt is homeless. Pt calm and cooperative in triage.   Pt states not able to get meds, pt requesting IP. Pt without meds for 1 month.   Pt states been drinking ETOH last night and also endorses cocaine use last night as well.

## 2024-03-14 NOTE — ED Notes (Signed)
 Pt belongings:   Orange UA backpack Black cell phone  Blue sweatshirt 1 pair of blue jeans Black puffer jacket  White sneakers Black t shirt 1 pair blue jeans $70 dollars in cash Cards in cig pack 1 belt with buckle Red nike shorts 1 pair of boxers 1 pair of white socks

## 2024-03-14 NOTE — ED Notes (Signed)
 Pt being escorted to [facility] room 1 at this time with Antony Baumgartner, ED Tech and security. Pt belongings taken with pt at this time.

## 2024-03-14 NOTE — ED Notes (Signed)
 Pt. To BHU from ED ambulatory without difficulty, to room  BHU 1. Report from [facility]. Pt. Is alert and oriented, warm and dry in no distress. Pt. Denies SI, HI, and AVH. Pt denies withdraw symptoms and states he has never had any. Patient states last use of alc, cocaine and weed was yesterday. Pt. Calm and cooperative. Pt. Made aware of security cameras and Q15 minute rounds. Pt. Encouraged to let Nursing staff know of any concerns or needs.    ENVIRONMENTAL ASSESSMENT Potentially harmful objects out of patient reach: Yes.   Personal belongings secured: Yes.   Patient dressed in hospital provided attire only: Yes.   Plastic bags out of patient reach: Yes.   Patient care equipment (cords, cables, call bells, lines, and drains) shortened, removed, or accounted for: Yes.   Equipment and supplies removed from bottom of stretcher: Yes.   Potentially toxic materials out of patient reach: Yes.   Sharps container removed or out of patient reach: Yes.

## 2024-03-15 DIAGNOSIS — F250 Schizoaffective disorder, bipolar type: Secondary | ICD-10-CM

## 2024-03-15 DIAGNOSIS — F1920 Other psychoactive substance dependence, uncomplicated: Secondary | ICD-10-CM

## 2024-03-15 DIAGNOSIS — R45851 Suicidal ideations: Secondary | ICD-10-CM

## 2024-03-15 DIAGNOSIS — R45850 Homicidal ideations: Secondary | ICD-10-CM

## 2024-03-15 LAB — RESP PANEL BY RT-PCR (RSV, FLU A&B, COVID)  RVPGX2
Influenza A by PCR: NEGATIVE
Influenza B by PCR: NEGATIVE
Resp Syncytial Virus by PCR: NEGATIVE
SARS Coronavirus 2 by RT PCR: NEGATIVE

## 2024-03-15 MED ORDER — OLANZAPINE 5 MG PO TABS: 15.0000 mg | ORAL_TABLET | Freq: Every day | ORAL | Status: DC

## 2024-03-15 MED ORDER — ESCITALOPRAM OXALATE 10 MG PO TABS
15.0000 mg | ORAL_TABLET | Freq: Every day | ORAL | Status: DC
  Administered 2024-03-15: 15 mg via ORAL
  Filled 2024-03-15: qty 2

## 2024-03-15 MED ORDER — HALOPERIDOL LACTATE 5 MG/ML IJ SOLN
2.0000 mg | Freq: Three times a day (TID) | INTRAMUSCULAR | Status: DC | PRN
Start: 2024-03-15 — End: 2024-03-15

## 2024-03-15 MED ORDER — HALOPERIDOL 5 MG PO TABS: 5.0000 mg | ORAL_TABLET | Freq: Three times a day (TID) | ORAL | Status: DC | PRN

## 2024-03-15 MED ORDER — HYDROXYZINE HCL 25 MG PO TABS: 50.0000 mg | ORAL_TABLET | Freq: Three times a day (TID) | ORAL | Status: DC | PRN

## 2024-03-15 MED ORDER — BUSPIRONE HCL 10 MG PO TABS
10.0000 mg | ORAL_TABLET | Freq: Two times a day (BID) | ORAL | Status: DC
  Administered 2024-03-15: 10 mg via ORAL
  Filled 2024-03-15: qty 1

## 2024-03-15 NOTE — ED Provider Notes (Signed)
 Emergency Medicine Observation Re-evaluation Note  ERMINIO NYGARD is a 48 y.o. male, seen on rounds today.  Pt initially presented to the ED for complaints of Mental Health Problem  Currently, the patient is resting comfortably.  Physical Exam  BP (!) 148/95 (BP Location: Right Arm)   Pulse 63   Temp 98.6 F (37 C) (Oral)   Resp 16   Ht 5\' 11"  (1.803 m)   Wt 93 kg   SpO2 97%   BMI 28.59 kg/m  General: No acute distress Cardiac: Well-perfused extremities Lungs: No respiratory distress Psych: Appropriate mood and affect  ED Course / MDM  EKG:   I have reviewed the labs performed to date as well as medications administered while in observation.  Recent changes in the last 24 hours include none.  Plan  Current plan is for placement.   Claressa Hughley K, MD 03/15/24 1026

## 2024-03-15 NOTE — BH Assessment (Signed)
 Patient has been accepted to [facility].  Patient assigned to Unit [address]. Accepting physician is Dr. Emilie Harden.  Call report to [phone].  Representative was Prudence.   ER Staff is aware of it:  Carlene, ER Secretary  Dr. Vallery Gavel, ER MD  Burdette Carolin Patient's Nurse     Patient can arrive at facility 03/15/24 anytime after 9 AM.

## 2024-03-15 NOTE — ED Notes (Signed)
 Pt given breakfast tray and juice.

## 2024-03-15 NOTE — ED Notes (Signed)
 EMTALA Reviewed by this RN.

## 2024-03-15 NOTE — Progress Notes (Addendum)
 Per [facility] Burdette Carolin), patient to be referred out of system.  Referral information for Psychiatric Hospitalization faxed to:  Destination  Service Provider Request Status Services Address Phone Fax Patient Preferred  [facility] Pending - Request Sent -- [address] [place], [place] [address] [phone] --  [facility] Pending - Request Sent -- [address] [place] [address] [phone] --  [facility] Pending - Request Sent -- [address] Montell Ao [place] [address] [phone] --  [facility] Pending - Request Sent -- [address] [place] [address] [phone] --  [facility] Pending - Request Sent -- [address] [place] [address] [phone] --  [facility] Pending - Request Sent -- [address] [place] [address] [phone] --  [facility] Patient Placement Pending - Request Sent -- [facility], [place] [phone] --  [facility] Pending - Request Sent -- [address], [place] [address] [phone] --  [facility] Pending - Request Sent -- [address] [place] [address] [phone] --  [facility] Axel Lent Pending - Request Sent -- [address] [place] [phone] --  [facility] Pending - Request Sent -- [address], [place] [address] [phone] --  [facility] Pending - Request Sent -- [address] [place] [address] [phone] --   Discharge Information

## 2024-03-15 NOTE — ED Notes (Signed)
 SAFE  TRANSPORT  CALLED  TO  TRANSPORT  PT  TO  [facility]

## 2024-03-15 NOTE — ED Provider Notes (Signed)
-----------------------------------------   1:36 AM on 03/15/2024 -----------------------------------------   Patient accepted to [facility] later this morning.   Zhanna Melin J, MD 03/15/24 [phone]

## 2024-03-15 NOTE — ED Notes (Signed)
 Patient accepted to [facility] to [address] /accepting physician is Dr. Emilie Harden call report to [phone] rep was Prudence

## 2024-07-08 ENCOUNTER — Emergency Department
Admission: EM | Admit: 2024-07-08 | Discharge: 2024-07-09 | Disposition: A | Discharge status: Other Acute Inpatient Hospital | Payer: MEDICAID

## 2024-07-08 ENCOUNTER — Encounter: Payer: Self-pay | Admitting: *Deleted

## 2024-07-08 ENCOUNTER — Other Ambulatory Visit: Payer: Self-pay

## 2024-07-08 DIAGNOSIS — F99 Mental disorder, not otherwise specified: Secondary | ICD-10-CM

## 2024-07-08 DIAGNOSIS — F1910 Other psychoactive substance abuse, uncomplicated: Secondary | ICD-10-CM

## 2024-07-08 DIAGNOSIS — Z8659 Personal history of other mental and behavioral disorders: Secondary | ICD-10-CM | POA: Diagnosis not present

## 2024-07-08 DIAGNOSIS — D72829 Elevated white blood cell count, unspecified: Secondary | ICD-10-CM | POA: Diagnosis not present

## 2024-07-08 DIAGNOSIS — F250 Schizoaffective disorder, bipolar type: Secondary | ICD-10-CM | POA: Diagnosis present

## 2024-07-08 DIAGNOSIS — R45851 Suicidal ideations: Secondary | ICD-10-CM | POA: Diagnosis not present

## 2024-07-08 DIAGNOSIS — F4312 Post-traumatic stress disorder, chronic: Secondary | ICD-10-CM

## 2024-07-08 DIAGNOSIS — R45850 Homicidal ideations: Secondary | ICD-10-CM | POA: Diagnosis not present

## 2024-07-08 DIAGNOSIS — F4310 Post-traumatic stress disorder, unspecified: Secondary | ICD-10-CM | POA: Diagnosis not present

## 2024-07-08 HISTORY — DX: Post-traumatic stress disorder, unspecified: F43.10

## 2024-07-08 HISTORY — DX: Depression, unspecified: F32.A

## 2024-07-08 HISTORY — DX: Anxiety disorder, unspecified: F41.9

## 2024-07-08 LAB — COMPREHENSIVE METABOLIC PANEL WITH GFR
ALT: 20 U/L (ref 0–44)
AST: 27 U/L (ref 15–41)
Albumin: 3.1 g/dL — ABNORMAL LOW (ref 3.5–5.0)
Alkaline Phosphatase: 68 U/L (ref 38–126)
Anion gap: 7 (ref 5–15)
BUN: 8 mg/dL (ref 6–20)
CO2: 23 mmol/L (ref 22–32)
Calcium: 8.4 mg/dL — ABNORMAL LOW (ref 8.9–10.3)
Chloride: 112 mmol/L — ABNORMAL HIGH (ref 98–111)
Creatinine, Ser: 1.12 mg/dL (ref 0.61–1.24)
GFR, Estimated: >60 mL/min (ref >60)
Glucose, Bld: 113 mg/dL — ABNORMAL HIGH (ref 70–99)
Potassium: 3.6 mmol/L (ref 3.5–5.1)
Sodium: 142 mmol/L (ref 135–145)
Total Bilirubin: 0.4 mg/dL (ref 0.0–1.2)
Total Protein: 6 g/dL — ABNORMAL LOW (ref 6.5–8.1)

## 2024-07-08 LAB — CBC
HCT: 44.8 % (ref 39.0–52.0)
Hemoglobin: 15.1 g/dL (ref 13.0–17.0)
MCH: 32.6 pg (ref 26.0–34.0)
MCHC: 33.7 g/dL (ref 30.0–36.0)
MCV: 96.8 fL (ref 80.0–100.0)
Platelets: 205 K/uL (ref 150–400)
RBC: 4.63 MIL/uL (ref 4.22–5.81)
RDW: 13.3 % (ref 11.5–15.5)
WBC: 13 K/uL — ABNORMAL HIGH (ref 4.0–10.5)
nRBC: 0 % (ref 0.0–0.2)

## 2024-07-08 LAB — ETHANOL: Alcohol, Ethyl (B): <15 mg/dL (ref <15)
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
## 2024-07-08 NOTE — ED Provider Notes (Signed)
 [facility] Provider Note    Event Date/Time   First MD Initiated Contact with Patient 07/08/24 1807     (approximate)   History   Psychiatric Evaluation  Pt arrives here, he states that he walked here. He states that he has mental health issues, he is dx with PTSD, anxiety and depression and sates that he needs help, pt appears emotional and tearful.  Pt is calm and cooperative and denies any HI or SI, pt last smokes marijuana last pm and occasionally uses cocaine/alcohol.    HPI Justin Neal is a 48 y.o. male with schizophrenia, PTSD, depression, anxiety presents for psychiatric evaluation -Patient states he is afraid he is going to snap --Notes primary stressor of his current living situation, is also not been taking his medications.  Does use alcohol, cocaine, and marijuana, all of which he used last night.  Has been having vague thoughts of wishing he did not exist but denies any clear suicidal plan.  Also expressed concern that he may hurt other people if he snaps but denies any active plan to harm others. -Denies hallucinations - No physical complaints today  Per chart review, has had prior psychiatric admissions.     Physical Exam   Triage Vital Signs: ED Triage Vitals  Encounter Vitals Group     BP 07/08/24 1734 (!) 149/91     Girls Systolic BP Percentile --      Girls Diastolic BP Percentile --      Boys Systolic BP Percentile --      Boys Diastolic BP Percentile --      Pulse Rate 07/08/24 1734 87     Resp 07/08/24 1734 16     Temp 07/08/24 1734 98.4 F (36.9 C)     Temp Source 07/08/24 1734 Oral     SpO2 07/08/24 1734 98 %     Weight --      Height --      Head Circumference --      Peak Flow --      Pain Score 07/08/24 1735 0     Pain Loc --      Pain Education --      Exclude from Growth Chart --     Most recent vital signs: Vitals:   07/08/24 1734 07/08/24 2042  BP: (!) 149/91 (!) 154/86  Pulse: 87 74  Resp: 16    Temp: 98.4 F (36.9 C) 97.9 F (36.6 C)  SpO2: 98% 100%     General: Awake, no distress.  HEENT: Normocephalic, atraumatic CV:  Good peripheral perfusion. RRR, RP 2+ Resp:  Normal effort. CTAB Abd:  No distention. Nontender to deep palpation throughout Neuro:  Endorses passive SI with no plan, denies hallucinations, does endorse concern about the possibility of hurting others if instigated but denies any active HI.  Calm and cooperative throughout exam, does not appear to be responding to internal stimuli, responds to all questions appropriately.   ED Results / Procedures / Treatments   Labs (all labs ordered are listed, but only abnormal results are displayed) Labs Reviewed  COMPREHENSIVE METABOLIC PANEL WITH GFR - Abnormal; Notable for the following components:      Result Value   Chloride 112 (*)    Glucose, Bld 113 (*)    Calcium 8.4 (*)    Total Protein 6.0 (*)    Albumin 3.1 (*)    All other components within normal limits  CBC - Abnormal; Notable for the  following components:   WBC 13.0 (*)    All other components within normal limits  ETHANOL  URINE DRUG SCREEN, QUALITATIVE ([facility] ONLY)     EKG  Ecg = sinus rhythm, rate 79, no gross ST elevation or depression, no significant repolarization normality, no axis, normal intervals.  No evidence of ischemia or arrhythmia on my read.  QTc normal.   RADIOLOGY N/a    PROCEDURES:  Critical Care performed: No  Procedures   MEDICATIONS ORDERED IN ED: Medications - No data to display   IMPRESSION / MDM / ASSESSMENT AND PLAN / ED COURSE  I reviewed the triage vital signs and the nursing notes.                              DDX/MDM/AP: Differential diagnosis includes, but is not limited to, primary psychiatric disorder, suspect concomitant substance use likely contributing to presentation as well as medication nonadherence.  Do not suspect underlying organic etiology at this time.  Does not clearly meet IVC  criteria at this time and is very calm and cooperative here and amenable to speaking with a psychiatrist--no negation for IVC, will place involuntary psychiatric hold pending psychiatric eval.  Plan: - Labs - EKG - Voluntary psychiatric hold, no IVC placed, does not clearly meet criteria at this time  Patient's presentation is most consistent with acute presentation with potential threat to life or bodily function.   ED course below.  Laboratory workup unremarkable, medically cleared.  Disposition pending psychiatric team, signed out to overnight ED provider pending psychiatric eval.  Clinical Course as of 07/08/24 2326  Sun Jul 08, 2024  1828 CBC with leukocytosis, nonspecific   CMP reviewed, unremarkable, at baseline  EtOH undetectable [MM]    Clinical Course User Index [MM] Clarine Ozell LABOR, MD     FINAL CLINICAL IMPRESSION(S) / ED DIAGNOSES   Final diagnoses:  Psychiatric problem     Rx / DC Orders   ED Discharge Orders     None        Note:  This document was prepared using Dragon voice recognition software and may include unintentional dictation errors.   Clarine Ozell LABOR, MD 07/08/24 [phone]

## 2024-07-08 NOTE — BHH Counselor (Signed)
 20:09 - Call placed to [facility] APP, Kathryne Show, NP, via Night Service Line [phone]) regarding request for psychiatric consult. This Clinical research associate was informed that the other two providers were unavailable and that [facility] staff were actively managing a crisis situation. Provider was unable to provide an estimated timeframe for completion of the psychiatric consult.

## 2024-07-08 NOTE — ED Notes (Signed)
 Tech provided pt with snack

## 2024-07-08 NOTE — ED Triage Notes (Signed)
 Pt arrives here, he states that he walked here. He states that he has mental health issues, he is dx with PTSD, anxiety and depression and sates that he needs help, pt appears emotional and tearful.  Pt is calm and cooperative and denies any HI or SI, pt last smokes marijuana last pm and occasionally uses cocaine/alcohol.

## 2024-07-08 NOTE — BH Assessment (Signed)
 Comprehensive Clinical Assessment (CCA) Screening, Triage and Referral Note  07/08/2024 SIDDH VANDEVENTER [id number]  Chief Complaint: Patient presents voluntarily stating he "needs help" due to ongoing mental health issues.Patient reports a history of PTSD, anxiety, and depression. He walked to the facility seeking assistance. Appears emotionally distressed and tearful but remains calm and cooperative throughout the encounter. Denies suicidal ideation (SI) or homicidal ideation (HI). Reports last use of marijuana last night and occasional use of cocaine and alcohol.  Patient displays no signs of acute intoxication or withdrawal.  Substance use may be contributing to emotional instability.  Patient reports a lack of social supports and states he has been bounce around from program to program trying to get help.    Visit Diagnosis: Anxiety, PTSD  Patient Reported Information How did you hear about us ? Self  What Is the Reason for Your Visit/Call Today? Pt here for mental health eval, pt states he is trying to get his stuff right. Pt states he was last seen at [facility] for about 1 month. Pt denies SI/HI, pt is homeless.  How Long Has This Been Causing You Problems? > than 6 months  What Do You Feel Would Help You the Most Today? Medication(s); Treatment for Depression or other mood problem; Stress Management; Food Assistance; Housing Assistance   Have You Recently Had Any Thoughts About Hurting Yourself? No  Are You Planning to Commit Suicide/Harm Yourself At This time? No   Have you Recently Had Thoughts About Hurting Someone Sherral? No  Are You Planning to Harm Someone at This Time? No  Explanation: Pt expressed vague SI without a plan. Pt reported waking up and feeling like I don't want to be here.   Have You Used Any Alcohol or Drugs in the Past 24 Hours? Yes  How Long Ago Did You Use Drugs or Alcohol? Pt reported using beer, weed, and coke on 03/13/24.  What Did You Use and How Much?  [name]   Do You Currently Have a Therapist/Psychiatrist? No  Name of Therapist/Psychiatrist: No data recorded  Have You Been Recently Discharged From Any Office Practice or Programs? Yes  Explanation of Discharge From Practice/Program: [facility]    CCA Screening Triage Referral Assessment Type of Contact: Face-to-Face  Telemedicine Service Delivery:   Is this Initial or Reassessment?   Date Telepsych consult ordered in [facility]:  Date Telepsych consult ordered in [facility]: 07/08/24  Time Telepsych consult ordered in [facility]:    Location of Assessment: [facility]  Provider Location: [facility]    Collateral Involvement: None provided   Does Patient Have a Court Appointed Legal Guardian? No data recorded Name and Contact of Legal Guardian: No data recorded If Minor and Not Living with Parent(s), Who has Custody? n/a  Is CPS involved or ever been involved? Never  Is APS involved or ever been involved? Never   Patient Determined To Be At Risk for Harm To Self or Others Based on Review of Patient Reported Information or Presenting Complaint? No  Method: No Plan  Availability of Means: No access or NA  Intent: Vague intent or NA  Notification Required: No need or identified person  Additional Information for Danger to Others Potential: -- (n/a)  Additional Comments for Danger to Others Potential: n/a  Are There Guns or Other Weapons in Your Home? No  Types of Guns/Weapons: n/a  Are These Weapons Safely Secured?  No  Who Could Verify You Are Able To Have These Secured: n/a  Do You Have any Outstanding Charges, Pending Court Dates, Parole/Probation? None reported  Contacted To Inform of Risk of Harm To Self or Others: -- (n/a)  Does Patient Present under Involuntary Commitment? No  County of Residence: [place]   Patient Currently Receiving the Following Services: Not Receiving Services   Determination of Need: Urgent (48 hours)   Options For  Referral: Medication Management   Disposition Recommendation per psychiatric provider: Pending psych consult.  Darlyne JAYSON Adolphus, Counselor
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
## 2024-07-08 NOTE — ED Notes (Signed)
 Pt belongings,  Green shirt, black socks, navy boxers, grey shorts, black shoes, brown belt, yellow basketball shorts. Pt also has a camo book bag (phone in book bag). Pt has two bags total.

## 2024-07-09 ENCOUNTER — Inpatient Hospital Stay
Admission: AD | Admit: 2024-07-09 | Discharge: 2024-07-16 | DRG: 885 | Disposition: A | Discharge status: Home / Self Care | Payer: 59 | Source: Intra-hospital | Attending: Psychiatry | Admitting: Psychiatry

## 2024-07-09 ENCOUNTER — Encounter: Payer: Self-pay | Admitting: Psychiatry

## 2024-07-09 DIAGNOSIS — F4312 Post-traumatic stress disorder, chronic: Secondary | ICD-10-CM

## 2024-07-09 DIAGNOSIS — F1290 Cannabis use, unspecified, uncomplicated: Secondary | ICD-10-CM | POA: Diagnosis present

## 2024-07-09 DIAGNOSIS — Z5982 Transportation insecurity: Secondary | ICD-10-CM

## 2024-07-09 DIAGNOSIS — F17200 Nicotine dependence, unspecified, uncomplicated: Secondary | ICD-10-CM | POA: Diagnosis present

## 2024-07-09 DIAGNOSIS — Z5900 Homelessness unspecified: Secondary | ICD-10-CM | POA: Diagnosis not present

## 2024-07-09 DIAGNOSIS — R45851 Suicidal ideations: Secondary | ICD-10-CM | POA: Diagnosis present

## 2024-07-09 DIAGNOSIS — I10 Essential (primary) hypertension: Secondary | ICD-10-CM | POA: Diagnosis present

## 2024-07-09 DIAGNOSIS — Z79899 Other long term (current) drug therapy: Secondary | ICD-10-CM | POA: Diagnosis not present

## 2024-07-09 DIAGNOSIS — F4310 Post-traumatic stress disorder, unspecified: Secondary | ICD-10-CM | POA: Diagnosis present

## 2024-07-09 DIAGNOSIS — F251 Schizoaffective disorder, depressive type: Principal | ICD-10-CM | POA: Diagnosis present

## 2024-07-09 DIAGNOSIS — Z5986 Financial insecurity: Secondary | ICD-10-CM | POA: Diagnosis not present

## 2024-07-09 DIAGNOSIS — Z91148 Patient's other noncompliance with medication regimen for other reason: Secondary | ICD-10-CM

## 2024-07-09 DIAGNOSIS — F250 Schizoaffective disorder, bipolar type: Secondary | ICD-10-CM | POA: Diagnosis not present

## 2024-07-09 DIAGNOSIS — F319 Bipolar disorder, unspecified: Secondary | ICD-10-CM | POA: Diagnosis present

## 2024-07-09 DIAGNOSIS — F419 Anxiety disorder, unspecified: Secondary | ICD-10-CM | POA: Diagnosis present

## 2024-07-09 DIAGNOSIS — F332 Major depressive disorder, recurrent severe without psychotic features: Secondary | ICD-10-CM | POA: Diagnosis present

## 2024-07-09 DIAGNOSIS — Z560 Unemployment, unspecified: Secondary | ICD-10-CM

## 2024-07-09 DIAGNOSIS — F1910 Other psychoactive substance abuse, uncomplicated: Secondary | ICD-10-CM

## 2024-07-09 DIAGNOSIS — F1490 Cocaine use, unspecified, uncomplicated: Secondary | ICD-10-CM | POA: Diagnosis present

## 2024-07-09 DIAGNOSIS — R45850 Homicidal ideations: Secondary | ICD-10-CM | POA: Diagnosis present

## 2024-07-09 MED ORDER — BUSPIRONE HCL 5 MG PO TABS
10.0000 mg | ORAL_TABLET | Freq: Two times a day (BID) | ORAL | Status: DC
  Administered 2024-07-09 (×4): 10 mg via ORAL
  Filled 2024-07-09 (×2): qty 2

## 2024-07-09 MED ORDER — OLANZAPINE 10 MG PO TABS
10.0000 mg | ORAL_TABLET | Freq: Every day | ORAL | Status: DC
  Administered 2024-07-09 – 2024-07-15 (×8): 10 mg via ORAL
  Filled 2024-07-09 (×6): qty 1

## 2024-07-09 MED ORDER — BUSPIRONE HCL 5 MG PO TABS
10.0000 mg | ORAL_TABLET | Freq: Two times a day (BID) | ORAL | Status: DC
  Administered 2024-07-09 – 2024-07-16 (×19): 10 mg via ORAL
  Filled 2024-07-09 (×14): qty 2

## 2024-07-09 MED ORDER — DIPHENHYDRAMINE HCL 25 MG PO CAPS: 50.0000 mg | ORAL_CAPSULE | Freq: Three times a day (TID) | ORAL | Status: DC | PRN

## 2024-07-09 MED ORDER — ALUM & MAG HYDROXIDE-SIMETH 200-200-20 MG/5ML PO SUSP: 30.0000 mL | ORAL | Status: DC | PRN

## 2024-07-09 MED ORDER — OLANZAPINE 10 MG PO TABS
10.0000 mg | ORAL_TABLET | Freq: Every day | ORAL | Status: DC
  Administered 2024-07-09 (×2): 10 mg via ORAL
  Filled 2024-07-09: qty 1

## 2024-07-09 MED ORDER — ACETAMINOPHEN 325 MG PO TABS
650.0000 mg | ORAL_TABLET | Freq: Four times a day (QID) | ORAL | Status: DC | PRN
  Administered 2024-07-14 – 2024-07-15 (×2): 650 mg via ORAL
  Filled 2024-07-09 (×2): qty 2

## 2024-07-09 MED ORDER — DIPHENHYDRAMINE HCL 50 MG/ML IJ SOLN: 50.0000 mg | Freq: Three times a day (TID) | INTRAMUSCULAR | Status: DC | PRN

## 2024-07-09 MED ORDER — LORAZEPAM 2 MG/ML IJ SOLN: 2.0000 mg | Freq: Three times a day (TID) | INTRAMUSCULAR | Status: DC | PRN

## 2024-07-09 MED ORDER — HALOPERIDOL 5 MG PO TABS: 5.0000 mg | ORAL_TABLET | Freq: Three times a day (TID) | ORAL | Status: DC | PRN

## 2024-07-09 MED ORDER — HALOPERIDOL LACTATE 5 MG/ML IJ SOLN
5.0000 mg | Freq: Three times a day (TID) | INTRAMUSCULAR | Status: AC | PRN
Start: 2024-07-09 — End: ?

## 2024-07-09 MED ORDER — ESCITALOPRAM OXALATE 10 MG PO TABS
10.0000 mg | ORAL_TABLET | Freq: Every day | ORAL | Status: DC
  Administered 2024-07-10 – 2024-07-16 (×9): 10 mg via ORAL
  Filled 2024-07-09 (×7): qty 1

## 2024-07-09 MED ORDER — GUANFACINE HCL 1 MG PO TABS
1.0000 mg | ORAL_TABLET | Freq: Every day | ORAL | Status: DC
  Administered 2024-07-09 – 2024-07-12 (×5): 1 mg via ORAL
  Filled 2024-07-09 (×4): qty 1

## 2024-07-09 MED ORDER — HALOPERIDOL 5 MG PO TABS: 5.0000 mg | ORAL_TABLET | Freq: Four times a day (QID) | ORAL | Status: DC | PRN

## 2024-07-09 MED ORDER — ESCITALOPRAM OXALATE 10 MG PO TABS
10.0000 mg | ORAL_TABLET | Freq: Every day | ORAL | Status: DC
  Administered 2024-07-09 (×2): 10 mg via ORAL
  Filled 2024-07-09: qty 1

## 2024-07-09 MED ORDER — HYDROXYZINE HCL 25 MG PO TABS: 25.0000 mg | ORAL_TABLET | Freq: Four times a day (QID) | ORAL | Status: DC | PRN

## 2024-07-09 MED ORDER — DIPHENHYDRAMINE HCL 25 MG PO CAPS: 50.0000 mg | ORAL_CAPSULE | Freq: Four times a day (QID) | ORAL | Status: DC | PRN

## 2024-07-09 MED ORDER — MAGNESIUM HYDROXIDE 400 MG/5ML PO SUSP: 30.0000 mL | Freq: Every day | ORAL | Status: DC | PRN

## 2024-07-09 MED ORDER — GUANFACINE HCL 1 MG PO TABS
1.0000 mg | ORAL_TABLET | Freq: Every day | ORAL | Status: DC
  Administered 2024-07-09 (×2): 1 mg via ORAL
  Filled 2024-07-09: qty 1

## 2024-07-09 MED ORDER — TRAZODONE HCL 50 MG PO TABS
50.0000 mg | ORAL_TABLET | Freq: Every evening | ORAL | Status: DC | PRN
  Administered 2024-07-09 (×2): 50 mg via ORAL
  Filled 2024-07-09 (×2): qty 1

## 2024-07-09 MED ORDER — MELATONIN 5 MG PO TABS: 2.5000 mg | ORAL_TABLET | Freq: Every evening | ORAL | Status: DC | PRN

## 2024-07-09 MED ORDER — HALOPERIDOL LACTATE 5 MG/ML IJ SOLN: 2.0000 mg | Freq: Four times a day (QID) | INTRAMUSCULAR | Status: DC | PRN

## 2024-07-09 MED ORDER — HALOPERIDOL LACTATE 5 MG/ML IJ SOLN: 10.0000 mg | Freq: Three times a day (TID) | INTRAMUSCULAR | Status: DC | PRN

## 2024-07-09 NOTE — Tx Team (Signed)
 Initial Treatment Plan 07/09/2024 3:19 PM Justin Neal FMW:[id number]    PATIENT STRESSORS: Financial difficulties   Other: Pt homeless and extrememly angry; [name] because pt unwilling to participate     PATIENT STRENGTHS: Other: unable to assess   PATIENT IDENTIFIED PROBLEMS: Mood Anger noncompliance                     DISCHARGE CRITERIA:  Ability to meet basic life and health needs Adequate post-discharge living arrangements Improved stabilization in mood, thinking, and/or behavior Verbal commitment to aftercare and medication compliance  PRELIMINARY DISCHARGE PLAN: Placement in alternative living arrangements  PATIENT/FAMILY INVOLVEMENT: This treatment plan has been presented to and reviewed with the patient, [name], and/or family member, [name]; pt unwilling to answer at admission.  The patient and family have been given the opportunity to ask questions and make suggestions.  [name], RN 07/09/2024, 3:19 PM

## 2024-07-09 NOTE — ED Notes (Addendum)
 Report called to Jon, pt going to room 307. Pt transported with all belongings, staff, and security. Pt aware of admission. RR even and unlabored. Pt denies no other needs at this time. Orders are in.

## 2024-07-09 NOTE — ED Notes (Signed)
 Pt "REFUSED" shower

## 2024-07-09 NOTE — Progress Notes (Signed)
   07/09/24 1402  Psych Admission Type (Psych Patients Only)  Admission Status Voluntary  Psychosocial Assessment  Patient Complaints Agitation;Anger;Suspiciousness  Eye Contact Darting;Glaring  Facial Expression Angry;Animated  Affect Angry  Speech Pressured;Abusive  Interaction Arrogant;Demanding;Defensive;Dominating  Motor Activity Posturing  Appearance/Hygiene Bizarre;In scrubs;Disheveled  Behavior Characteristics Unwilling to participate;Agressive verbally;Agitated;Guarded;Impulsive;Irritable;Posturing  Mood Angry  Aggressive Behavior  Effect No apparent injury  Thought Process  Coherency Tangential  Content Blaming others;Preoccupation;Paranoia  Delusions Persecutory  Perception [name]  Hallucination [name]  Judgment Impaired  Confusion None  Danger to Self  Current suicidal ideation?  ([name] pt unwilling to participate)  Danger to Others  Danger to Others Reported or observed  Danger to Others Abnormal  Harmful Behavior to others Threats of violence towards other people observed or expressed   Description of Harmful Behavior threatening speech towards the whole world  Destructive Behavior No threats or harm toward property

## 2024-07-09 NOTE — Plan of Care (Signed)

## 2024-07-09 NOTE — Group Note (Signed)
 Date:  07/09/2024 Time:  9:28 PM  Group Topic/Focus:  Personal Choices and Values:   The focus of this group is to help patients assess and explore the importance of values in their lives, how their values affect their decisions, how they express their values and what opposes their expression.   PT did not attend group.  Slade Pierpoint L 07/09/2024, 9:28 PM

## 2024-07-09 NOTE — ED Notes (Signed)
 Meal tray provided. Tray checked for potential hazards. Pt denies no other needs at this time.

## 2024-07-09 NOTE — Progress Notes (Signed)
 Pt now brightens when you talk with him.  He has good eye contact and smiles, is polite and considerate.  Completely different than upon admission.

## 2024-07-09 NOTE — BH Assessment (Signed)
 Patient is to be admitted to [facility] by Dr. Jadapalle.  Attending Physician will be Dr. Jadapalle.   Patient has been assigned to room 307, by [facility] Charge Nurse Gigi.    ER staff is aware of the admission: Olam, ER Secretary   Dr. Dicky, ER MD  Leonor, Patient's Nurse  Curtistine, Patient Access.

## 2024-07-09 NOTE — ED Notes (Signed)
 VOL/Inpt psych admission recommended

## 2024-07-09 NOTE — Group Note (Signed)
 Recreation Therapy Group Note   Group Topic:Coping Skills  Group Date: 07/09/2024 Start Time: 1530 End Time: 1550 Facilitators: Celestia Jeoffrey FORBES ARTICE, CTRS Location: Craft Room  Group Description: Mind Map.  Patient was provided a blank template of a diagram with 32 blank boxes in a tiered system, branching from the center (similar to a bubble chart). LRT directed patients to label the middle of the diagram Coping Skills. LRT and patients then came up with 8 different coping skills as examples. Pt were directed to record their coping skills in the 2nd tier boxes closest to the center.  Patients would then share their coping skills with the group as LRT wrote them out. LRT gave a handout of 99 different coping skills at the end of group.   Goal Area(s) Addressed: Patients will be able to define "coping skills". Patient will identify new coping skills.  Patient will increase communication.   Affect/Mood: N/A   Participation Level: Did not attend    Clinical Observations/Individualized Feedback: Patient did not attend group.   Plan: Continue to engage patient in RT group sessions 2-3x/week.   Jeoffrey FORBES Celestia, LRT, CTRS 07/09/2024 5:32 PM

## 2024-07-09 NOTE — ED Provider Notes (Signed)
 Emergency Medicine Observation Re-evaluation Note  Justin Neal is a 48 y.o. male, seen on rounds today.  Pt initially presented to the ED for complaints of Psychiatric Evaluation Currently, the patient is resting.  Physical Exam  BP (!) 154/86 (BP Location: Right Arm)   Pulse 74   Temp 97.9 F (36.6 C) (Oral)   Resp 16   SpO2 100%  Physical Exam Gen:  No acute distress Resp:  Breathing easily and comfortably, no accessory muscle usage Neuro:  Moving all four extremities, no gross focal neuro deficits Psych:  Resting currently, calm when awake  ED Course / MDM  EKG:   I have reviewed the labs performed to date as well as medications administered while in observation.  Recent changes in the last 24 hours include initial EDP evaluation and psych evaluation.  Plan  Current plan is for inpatient psych treatment.    Gordan Huxley, MD 07/09/24 [phone]

## 2024-07-09 NOTE — Progress Notes (Signed)
   07/09/24 1402  Charting Type  Charting Type Admission  Focused Reassessment No Changes Psychosocial  Focused Reassessment Changes Noted Psychosocial  Safety Check Verification  Has the RN verified the 15 minute safety check completion? Yes  Neurological  Neuro (WDL) X  Speech Incomprehensible (loud, using much profanity and getting louder and louder, tangential with pressured speech, blaming others, paranoid)  Neuro symptoms relieved by Other (Comment) (redirection)  HEENT  HEENT (WDL) X (unknown pt refused to answer)  Respiratory  Respiratory Pattern Regular;Unlabored  Chest Assessment Chest expansion symmetrical  Respiratory (WDL) WDL  Cardiac  Cardiac (WDL) WDL  Vascular  Vascular (WDL) WDL  Integumentary  Integumentary (WDL) X (tattoos arms and legs and torso)  Staff Member Assisting with Skin Assessment on Admission Gigi RN  Skin Color Appropriate for ethnicity  Skin Condition Dry  Skin Integrity Intact  Skin Turgor Non-tenting  Braden Scale (Ages 8 and up)  Sensory Perceptions 4  Moisture 4  Activity 4  Mobility 4  Nutrition 3  Friction and Shear 3  Braden Scale Score 22  Musculoskeletal  Musculoskeletal (WDL) WDL  Gastrointestinal  Gastrointestinal (WDL) X (unknown pt refused to answer)  GU Assessment  Genitourinary (WDL) WDL  Urine Measurement/Characteristics  Hygiene  (refused)  Neurological  Level of Consciousness Alert

## 2024-07-09 NOTE — ED Notes (Signed)
 Pt resting in bed with eyes closed. Pt is well appearing with RR even and unlabored.

## 2024-07-09 NOTE — Group Note (Signed)
 [facility] LCSW Group Therapy Note    Group Date: 07/09/2024 Start Time: 1300 End Time: 1400  Type of Therapy and Topic:  Group Therapy:  Overcoming Obstacles  Participation Level:  [facility] PARTICIPATION LEVEL: Did Not Attend  Mood:  Description of Group:   In this group patients will be encouraged to explore what they see as obstacles to their own wellness and recovery. They will be guided to discuss their thoughts, feelings, and behaviors related to these obstacles. The group will process together ways to cope with barriers, with attention given to specific choices patients can make. Each patient will be challenged to identify changes they are motivated to make in order to overcome their obstacles. This group will be process-oriented, with patients participating in exploration of their own experiences as well as giving and receiving support and challenge from other group members.  Therapeutic Goals: 1. Patient will identify personal and current obstacles as they relate to admission. 2. Patient will identify barriers that currently interfere with their wellness or overcoming obstacles.  3. Patient will identify feelings, thought process and behaviors related to these barriers. 4. Patient will identify two changes they are willing to make to overcome these obstacles:    Summary of Patient Progress   X   Therapeutic Modalities:   Cognitive Behavioral Therapy Solution Focused Therapy Motivational Interviewing Relapse Prevention Therapy   Sherryle JINNY Margo, LCSW

## 2024-07-09 NOTE — Progress Notes (Signed)
   07/09/24 2200  Psych Admission Type (Psych Patients Only)  Admission Status Voluntary  Psychosocial Assessment  Patient Complaints None  Eye Contact Fair  Facial Expression Other (Comment) (Grinning)  Affect Appropriate to circumstance  Speech Logical/coherent  Interaction Minimal  Motor Activity Slow  Appearance/Hygiene In scrubs  Behavior Characteristics Cooperative;Appropriate to situation  Mood Pleasant  Thought Process  Coherency WDL  Content WDL  Delusions None reported or observed  Perception WDL  Hallucination None reported or observed  Judgment WDL  Confusion None  Danger to Self  Current suicidal ideation? Denies  Self-Injurious Behavior No self-injurious ideation or behavior indicators observed or expressed   Agreement Not to Harm Self Yes  Description of Agreement Verbalized  Danger to Others  Danger to Others None reported or observed  Danger to Others Abnormal  Harmful Behavior to others No threats or harm toward other people  Destructive Behavior No threats or harm toward property

## 2024-07-09 NOTE — Progress Notes (Signed)
   07/09/24 1826  Psych Admission Type (Psych Patients Only)  Admission Status Voluntary  Psychosocial Assessment  Patient Complaints None  Eye Contact Fair  Facial Expression Animated;Other (Comment) (bright and smiling)  Affect Other (Comment) (happy-appearing)  Speech Other (Comment) (appropriate)  Interaction Other (Comment) (pleasant and appropriate)  Motor Activity Other (Comment) (normal)  Appearance/Hygiene Unremarkable;In scrubs  Behavior Characteristics Calm;Cooperative;Appropriate to situation  Mood Pleasant  Aggressive Behavior  Targets Other (Comment) (none)  Type of Behavior Other (Comment) (none)  Effect No apparent injury  Thought Process  Coherency WDL  Content WDL  Delusions None reported or observed  Perception WDL  Hallucination None reported or observed  Judgment WDL  Confusion None  Danger to Self  Current suicidal ideation? Denies  Self-Injurious Behavior No self-injurious ideation or behavior indicators observed or expressed   Agreement Not to Harm Self Yes  Description of Agreement verbal  Danger to Others  Danger to Others None reported or observed  Danger to Others Abnormal  Harmful Behavior to others No threats or harm toward other people  Description of Harmful Behavior none  Destructive Behavior No threats or harm toward property

## 2024-07-10 DIAGNOSIS — F332 Major depressive disorder, recurrent severe without psychotic features: Secondary | ICD-10-CM

## 2024-07-10 NOTE — H&P (Signed)
 Psychiatric Admission Assessment Adult  Patient Identification: Justin Neal MRN:  [id number] Date of Evaluation:  07/10/2024 Chief Complaint:  MDD (major depressive disorder), recurrent severe, without psychosis (HCC) [F33.2]   History of Present Illness: The patient is a 48 y/o male with a reported psychiatric history of schizophrenia, bipolar disorder, post-traumatic stress disorder, anxiety, and depression, presenting voluntarily for psychiatric admission following an ED evaluation for worsening mood instability, paranoia, irritability, and suicidal ideation. He reports being off all prescribed psychiatric medications for the past 1.5 months. He states he recently moved back to [place]  from [place] , where he had been working, but since returning has experienced increasing psychosocial stressors including loss of his car, reliance on others for transportation, housing instability, and living in boarding houses with individuals using illicit substances. He reports daily cannabis use and cocaine use a few times per week as self-medication.  The patient describes pervasive depressive symptoms including hopelessness, helplessness, worthlessness, anhedonia, anergia, amotivation, poor concentration, decreased appetite, and disrupted sleep averaging 3-4 hours per night. He reports episodes consistent with hypomania including hyperactivity, decreased need for sleep, grandiosity, erratic behavior, and involvement in dangerous activities. He endorses intrusive trauma-related thoughts and occasional nightmares. He denies current auditory or visual hallucinations but demonstrates ongoing paranoid beliefs, stating that police and anonymous groups have followed him from state to state, are hacking his accounts, and are attempting to erase his existence. He describes religious preoccupation and beliefs that the world is "full of evil."  He reports current passive suicidal ideation with thoughts of walking  on train tracks and not caring if he dies, as well as vague homicidal ideation expressed as feeling he "doesn't care about hurting others" because "no one cares about him," though he denies a specific target or plan. His speech is pressured, affect is labile, and he is irritable during interview. He denies panic attacks or obsessive-compulsive symptoms. He reports his most recent medication regimen included buspirone , escitalopram , and olanzapine , but he has not been compliant since stopping all medications 1.5 months ago. He declines long-acting injectable antipsychotic treatment. EKG from ED shows QTc 460 ms. Collateral review reveals multiple psychiatric hospitalizations this year, most recently in February and April for similar presentations.   Total Time spent with patient: 1 hour Sleep  Sleep:Sleep: Fair  Past Psychiatric History:   Psychiatric History:  Information collected from patient and chart review  Prev Dx/Sx: schizophrenia, bipolar disorder, post-traumatic stress disorder, anxiety, and depression Current Psych Provider: None Home Meds (current): Patient is currently off all medications Previous Med Trials: trazodone , Zyprexa , BuSpar , Lexapro , Ativan  Therapy: None  Prior Psych Hospitalization: multiple April and february Prior Self Harm: Denies history of previous suicide attempts Prior Violence: Denies history of previous homicidal gestures  Family Psych History: Patient is unclear Family Hx suicide: None reported  Social History:  Patient is homeless and unemployed he denies current legal consequences and denies access to a weapon. Substance History Alcohol: Denies daily use reports will drink 3-4 times a week no withdrawal symptoms no alcohol in system on admission Endorses previous rehab stays Is the patient at risk to self? Yes.    Has the patient been a risk to self in the past 6 months? Yes.    Has the patient been a risk to self within the distant past? No.  Is  the patient a risk to others? Yes.    Has the patient been a risk to others in the past 6 months? No.  Has the patient been a  risk to others within the distant past? No.   [place] Scale:  Flowsheet Row Admission (Current) from 07/09/2024 in [facility] INPATIENT [facility] ED from 07/08/2024 in [facility] Emergency Department at [facility] ED from 03/14/2024 in [facility] Emergency Department at [facility]  C-SSRS RISK CATEGORY High Risk High Risk No Risk     Past Medical History:  Past Medical History:  Diagnosis Date   Anxiety    Depression    PTSD (post-traumatic stress disorder)    Schizophrenia (HCC)    History reviewed. No pertinent surgical history. Family History: History reviewed. No pertinent family history.  Social History:  Social History   Substance and Sexual Activity  Alcohol Use Not Currently     Social History   Substance and Sexual Activity  Drug Use Yes   Types: Marijuana, Cocaine      Allergies:  No Known Allergies Lab Results:  Results for orders placed or performed during the hospital encounter of 07/08/24 (from the past 48 hours)  Comprehensive metabolic panel     Status: Abnormal   Collection Time: 07/08/24  5:46 PM  Result Value Ref Range   Sodium 142 135 - 145 mmol/L   Potassium 3.6 3.5 - 5.1 mmol/L   Chloride 112 (H) 98 - 111 mmol/L   CO2 23 22 - 32 mmol/L   Glucose, Bld 113 (H) 70 - 99 mg/dL    Comment: Glucose reference range applies only to samples taken after fasting for at least 8 hours.   BUN 8 6 - 20 mg/dL   Creatinine, Ser 8.87 0.61 - 1.24 mg/dL   Calcium 8.4 (L) 8.9 - 10.3 mg/dL   Total Protein 6.0 (L) 6.5 - 8.1 g/dL   Albumin 3.1 (L) 3.5 - 5.0 g/dL   AST 27 15 - 41 U/L   ALT 20 0 - 44 U/L   Alkaline Phosphatase 68 38 - 126 U/L   Total Bilirubin 0.4 0.0 - 1.2 mg/dL   GFR, Estimated >39 >39 mL/min    Comment: (NOTE) Calculated using the CKD-EPI Creatinine Equation (2021)    Anion gap 7 5 - 15    Comment:  Performed at [facility], [address] [place], [place] [address]  Ethanol     Status: None   Collection Time: 07/08/24  5:46 PM  Result Value Ref Range   Alcohol, Ethyl (B) <15 <15 mg/dL    Comment: (NOTE) For medical purposes only. Performed at [facility], [address] [place], [place] [address]   cbc     Status: Abnormal   Collection Time: 07/08/24  5:46 PM  Result Value Ref Range   WBC 13.0 (H) 4.0 - 10.5 K/uL   RBC 4.63 4.22 - 5.81 MIL/uL   Hemoglobin 15.1 13.0 - 17.0 g/dL   HCT 55.1 60.9 - 47.9 %   MCV 96.8 80.0 - 100.0 fL   MCH 32.6 26.0 - 34.0 pg   MCHC 33.7 30.0 - 36.0 g/dL   RDW 86.6 88.4 - 84.4 %   Platelets 205 150 - 400 K/uL   nRBC 0.0 0.0 - 0.2 %    Comment: Performed at [facility], [address], [place], [place] [address]    Blood Alcohol level:  Lab Results  Component Value Date   [facility] <15 07/08/2024   ETH <10 03/14/2024    Metabolic Disorder Labs:  No results found for: HGBA1C, MPG No results found for: PROLACTIN No results found for: CHOL, TRIG, HDL,  CHOLHDL, VLDL, LDLCALC  Current Medications: Current Facility-Administered Medications  Medication Dose Route Frequency Provider Last Rate Last Admin   acetaminophen  (TYLENOL ) tablet 650 mg  650 mg Oral Q6H PRN Jadapalle, Sree, MD       alum & mag hydroxide-simeth (MAALOX/MYLANTA) 200-200-20 MG/5ML suspension 30 mL  30 mL Oral Q4H PRN Jadapalle, Sree, MD       busPIRone  (BUSPAR ) tablet 10 mg  10 mg Oral BID Jadapalle, Sree, MD   10 mg at 07/10/24 9153   haloperidol  (HALDOL ) tablet 5 mg  5 mg Oral TID PRN Jadapalle, Sree, MD       And   diphenhydrAMINE  (BENADRYL ) capsule 50 mg  50 mg Oral TID PRN Jadapalle, Sree, MD       haloperidol  lactate (HALDOL ) injection 5 mg  5 mg Intramuscular TID PRN Jadapalle, Sree, MD       And   diphenhydrAMINE  (BENADRYL ) injection 50 mg  50 mg Intramuscular TID PRN Jadapalle, Sree, MD       And   LORazepam  (ATIVAN )  injection 2 mg  2 mg Intramuscular TID PRN Jadapalle, Sree, MD       haloperidol  lactate (HALDOL ) injection 10 mg  10 mg Intramuscular TID PRN Jadapalle, Sree, MD       And   diphenhydrAMINE  (BENADRYL ) injection 50 mg  50 mg Intramuscular TID PRN Jadapalle, Sree, MD       And   LORazepam  (ATIVAN ) injection 2 mg  2 mg Intramuscular TID PRN Jadapalle, Sree, MD       escitalopram  (LEXAPRO ) tablet 10 mg  10 mg Oral Daily Jadapalle, Sree, MD   10 mg at 07/10/24 0846   guanFACINE  (TENEX ) tablet 1 mg  1 mg Oral QHS Jadapalle, Sree, MD   1 mg at 07/09/24 2119   hydrOXYzine  (ATARAX ) tablet 25 mg  25 mg Oral Q6H PRN Jadapalle, Sree, MD       magnesium  hydroxide (MILK OF MAGNESIA) suspension 30 mL  30 mL Oral Daily PRN Jadapalle, Sree, MD       OLANZapine  (ZYPREXA ) tablet 10 mg  10 mg Oral QHS Jadapalle, Sree, MD   10 mg at 07/09/24 2119   traZODone  (DESYREL ) tablet 50 mg  50 mg Oral QHS PRN Jadapalle, Sree, MD   50 mg at 07/09/24 2119   PTA Medications: Medications Prior to Admission  Medication Sig Dispense Refill Last Dose/Taking   busPIRone  (BUSPAR ) 10 MG tablet Take 10 mg by mouth 2 (two) times daily.      escitalopram  (LEXAPRO ) 10 MG tablet Take 15 mg by mouth daily.      melatonin 3 MG TABS tablet Take 3 mg by mouth at bedtime.      nicotine (NICODERM CQ - DOSED IN MG/24 HOURS) 21 mg/24hr patch Place 21 mg onto the skin daily.      OLANZapine  (ZYPREXA ) 5 MG tablet Take 5 mg by mouth in the morning and 15 mg at bedtime.       Psychiatric Specialty Exam:  Presentation  General Appearance: Casual  Eye Contact:Fair  Speech:Clear and Coherent  Speech Volume:Increased    Mood and Affect  Mood:Dysphoric; Labile; Irritable  Affect:Blunt   Thought Process  Thought Processes:Disorganized  Descriptions of Associations:Tangential  Orientation:Full (Time, Place and Person)  Thought Content:Paranoid Ideation  Hallucinations:Hallucinations: None  Ideas of Reference:Percusatory;  Paranoia  Suicidal Thoughts:Suicidal Thoughts: No  Homicidal Thoughts:Homicidal Thoughts: Yes, Passive   Sensorium  Memory:Immediate Fair  Judgment:Impaired  Insight:Lacking   Executive Functions  Concentration:Poor  Attention Span:Fair  Recall:Poor  Fund of Knowledge:Fair  Language:Fair   Psychomotor Activity  Psychomotor Activity:Psychomotor Activity: Normal   Assets  Assets:Communication Skills; Desire for Improvement    Musculoskeletal: Strength & Muscle Tone: within normal limits Gait & Station: normal  Physical Exam: Physical Exam Vitals and nursing note reviewed.  HENT:     Head: Atraumatic.  Eyes:     Extraocular Movements: Extraocular movements intact.  Pulmonary:     Effort: Pulmonary effort is normal.  Neurological:     Mental Status: He is alert and oriented to person, place, and time.    Review of Systems  Psychiatric/Behavioral:  Positive for depression, substance abuse and suicidal ideas. Negative for hallucinations. The patient has insomnia.    Blood pressure 119/76, pulse (!) 43, temperature (!) 97.2 F (36.2 C), resp. rate 17, height 5' 11 (1.803 m), weight 95.3 kg, SpO2 98%. Body mass index is 29.29 kg/m.  Principal Diagnosis: MDD (major depressive disorder), recurrent severe, without psychosis (HCC) Diagnosis:  Principal Problem:   MDD (major depressive disorder), recurrent severe, without psychosis (HCC)   Clinical Decision Making:  This is a 48 y/o male patient with a history of schizophrenia, bipolar disorder, PTSD, anxiety, and depression, presenting after a 1.29-month lapse in medication adherence, with acute exacerbation of mood instability, paranoia, depressive symptoms, and suicidal ideation. His presentation is notable for affective lability, pressured speech, irritability, paranoid delusions, religious preoccupation, and both suicidal and vague homicidal thoughts without a specific plan. He also demonstrates symptoms of  PTSD with intrusive trauma-related thoughts and nightmares, as well as possible bipolar affective disorder with mixed features given co-occurrence of depressive and hypomanic symptoms.  Risk factors for suicide include active suicidal ideation with a potential plan, recent medication noncompliance, paranoia, substance use (daily cannabis and intermittent cocaine), recent relocation, unemployment, housing instability, multiple recent hospitalizations, and chronic mental illness. Homicide risk is elevated given vague homicidal statements, paranoia, irritability, and mood instability, though mitigated by lack of specific target or plan. Protective factors are limited but include voluntary help-seeking behavior and willingness to engage in care.  He meets criteria for inpatient psychiatric admission for safety, stabilization, medication management, and substance use assessment. Acute suicide and violence risk are both considered moderate-to-high in the current context. Prognosis is unclear given poor medication adherence, chronicity of illness, and ongoing psychosocial instability.  Treatment Plan Summary:  Safety and Monitoring:             -- Voluntary admission to inpatient psychiatric unit for safety, stabilization and treatment             -- Daily contact with patient to assess and evaluate symptoms and progress in treatment             -- Patient's case to be discussed in multi-disciplinary team meeting             -- Observation Level: q15 minute checks             -- Vital signs:  q12 hours             -- Precautions: suicide, elopement, and assault   2. Psychiatric Diagnoses and Treatment:              Schizoaffective disorder Anxiety  Depression  Polysubstance use  PTSD Home meds were restarted in the ED BuSpar  10 mg twice daily, Lexapro  10 mg daily, Zyprexa  10 mg at bedtime and patient was also started on Tenex  by ED provider off label for  PTSD and impulsivity   -- The  risks/benefits/side-effects/alternatives to this medication were discussed in detail with the patient and time was given for questions. The patient consents to medication trial.                -- Metabolic profile and EKG monitoring obtained while on an atypical antipsychotic (BMI: Lipid Panel: HbgA1c: QTc:)              -- Encouraged patient to participate in unit milieu and in scheduled group therapies                            3. Medical Issues Being Addressed:    No acute concerns 4. Discharge Planning:              -- Social work and case management to assist with discharge planning and identification of hospital follow-up needs prior to discharge             -- Estimated LOS: 5-7 days             -- Discharge Concerns: Need to establish a safety plan; Medication compliance and effectiveness             -- Discharge Goals: Return home with outpatient referrals follow ups  Physician Treatment Plan for Primary Diagnosis: MDD (major depressive disorder), recurrent severe, without psychosis (HCC) Long Term Goal(s): Improvement in symptoms so as ready for discharge  Short Term Goals: Ability to demonstrate self-control will improve, Ability to identify and develop effective coping behaviors will improve, Ability to maintain clinical measurements within normal limits will improve, Compliance with prescribed medications will improve, and Ability to identify triggers associated with substance abuse/mental health issues will improve   I certify that inpatient services furnished can reasonably be expected to improve the patient's condition.    Donnice FORBES Right, PA-C 8/12/20254:12 PM

## 2024-07-10 NOTE — Plan of Care (Signed)
   Problem: Safety: Goal: Periods of time without injury will increase Outcome: Progressing

## 2024-07-10 NOTE — Progress Notes (Signed)
 Pt calm and pleasant during assessment denying SI/HI/AVH. Pt stated that he had a good day and has been sleeping well here. Pt compliant with medication administration per MD orders. Pt given education, support, and encouragement to be active in his treatment plan. Pt being monitored Q15 minutes for safety per unit protocol, remains safe on the unit

## 2024-07-10 NOTE — Group Note (Signed)
 Date:  07/10/2024 Time:  10:06 AM  Group Topic/Focus:  Goals Group:   The focus of this group is to help patients establish daily goals to achieve during treatment and discuss how the patient can incorporate goal setting into their daily lives to aide in recovery.    Participation Level:  Did Not Attend   Deitra Clap [facility] 07/10/2024, 10:06 AM

## 2024-07-10 NOTE — Progress Notes (Signed)
   07/10/24 1200  Psych Admission Type (Psych Patients Only)  Admission Status Voluntary  Psychosocial Assessment  Patient Complaints None  Eye Contact Fair  Facial Expression Other (Comment) (WNL)  Affect Appropriate to circumstance  Speech Logical/coherent  Interaction Minimal  Motor Activity Slow  Appearance/Hygiene In scrubs  Behavior Characteristics Cooperative;Appropriate to situation  Mood Labile  Thought Process  Coherency WDL  Content Blaming others  Delusions None reported or observed  Perception WDL  Hallucination None reported or observed  Judgment Impaired  Confusion None  Danger to Self  Current suicidal ideation? Denies  Self-Injurious Behavior No self-injurious ideation or behavior indicators observed or expressed   Agreement Not to Harm Self Yes  Description of Agreement verbal  Danger to Others  Danger to Others None reported or observed  Danger to Others Abnormal  Harmful Behavior to others No threats or harm toward other people   Patient stated that he had a good day today and he feels safe here. Patient visible in the milieu.

## 2024-07-10 NOTE — [facility] Suicide Risk Assessment (Signed)
 [facility] Admission Suicide Risk Assessment   Nursing information obtained from:  Patient Demographic factors:  Male, Low socioeconomic status, Unemployed Current Mental Status:  Thoughts of violence towards others Loss Factors:  Decrease in vocational status Historical Factors:  NA ([name];pt unwilling to participate) Risk Reduction Factors:  NA ([name])  Total Time spent with patient: 1 hour Principal Problem: MDD (major depressive disorder), recurrent severe, without psychosis (HCC) Diagnosis:  Principal Problem:   MDD (major depressive disorder), recurrent severe, without psychosis (HCC)  Subjective Data:  This is a 48 y/o male patient with a history of schizophrenia, bipolar disorder, PTSD, anxiety, and depression, presenting after a 1.43-month lapse in medication adherence, with acute exacerbation of mood instability, paranoia, depressive symptoms, and suicidal ideation. His presentation is notable for affective lability, pressured speech, irritability, paranoid delusions, religious preoccupation, and both suicidal and vague homicidal thoughts without a specific plan. He also demonstrates symptoms of PTSD with intrusive trauma-related thoughts and nightmares, as well as possible bipolar affective disorder with mixed features given co-occurrence of depressive and hypomanic symptoms.  Risk factors for suicide include active suicidal ideation with a potential plan, recent medication noncompliance, paranoia, substance use (daily cannabis and intermittent cocaine), recent relocation, unemployment, housing instability, multiple recent hospitalizations, and chronic mental illness. Homicide risk is elevated given vague homicidal statements, paranoia, irritability, and mood instability, though mitigated by lack of specific target or plan. Protective factors are limited but include voluntary help-seeking behavior and willingness to engage in care.  He meets criteria for inpatient psychiatric admission for safety,  stabilization, medication management, and substance use assessment. Acute suicide and violence risk are both considered moderate-to-high in the current context. Prognosis is unclear given poor medication adherence, chronicity of illness, and ongoing psychosocial instability.  Continued Clinical Symptoms:  Alcohol Use Disorder Identification Test Final Score (AUDIT): 6 The Alcohol Use Disorders Identification Test, Guidelines for Use in Primary Care, Second Edition.  World Science writer [facility]). Score between 0-7:  no or low risk or alcohol related problems. Score between 8-15:  moderate risk of alcohol related problems. Score between 16-19:  high risk of alcohol related problems. Score 20 or above:  warrants further diagnostic evaluation for alcohol dependence and treatment.   CLINICAL FACTORS:   Alcohol/Substance Abuse/Dependencies More than one psychiatric diagnosis Unstable or Poor Therapeutic Relationship Previous Psychiatric Diagnoses and Treatments   Musculoskeletal: Strength & Muscle Tone: within normal limits Gait & Station: normal Patient leans: N/A  Psychiatric Specialty Exam:  Presentation  General Appearance:  Casual  Eye Contact: Fair  Speech: Clear and Coherent  Speech Volume: Increased  Handedness:No data recorded  Mood and Affect  Mood: Dysphoric; Labile; Irritable  Affect: Blunt   Thought Process  Thought Processes: Disorganized  Descriptions of Associations:Tangential  Orientation:Full (Time, Place and Person)  Thought Content:Paranoid Ideation  History of Schizophrenia/Schizoaffective disorder:Yes  Duration of Psychotic Symptoms:Greater than six months  Hallucinations:Hallucinations: None  Ideas of Reference:Percusatory; Paranoia  Suicidal Thoughts:Suicidal Thoughts: No  Homicidal Thoughts:Homicidal Thoughts: Yes, Passive   Sensorium  Memory: Immediate Fair  Judgment: Impaired  Insight: Lacking   Executive  Functions  Concentration: Poor  Attention Span: Fair  Recall: Poor  Fund of Knowledge: Fair  Language: Fair   Psychomotor Activity  Psychomotor Activity: Psychomotor Activity: Normal   Assets  Assets: Communication Skills; Desire for Improvement   Sleep  Sleep: Sleep: Fair    Physical Exam: Physical Exam ROS Blood pressure 119/76, pulse (!) 43, temperature (!) 97.2 F (36.2 C), resp. rate 17, height 5'  11 (1.803 m), weight 95.3 kg, SpO2 98%. Body mass index is 29.29 kg/m.   COGNITIVE FEATURES THAT CONTRIBUTE TO RISK:  Closed-mindedness    SUICIDE RISK:   Moderate:  Frequent suicidal ideation with limited intensity, and duration, some specificity in terms of plans, no associated intent, good self-control, limited dysphoria/symptomatology, some risk factors present, and identifiable protective factors, including available and accessible social support.  PLAN OF CARE:   Safety and Monitoring:   --  Admission to inpatient psychiatric unit for safety, stabilization and treatment -- Daily contact with patient to assess and evaluate symptoms and progress in treatment -- Patient's case to be discussed in multi-disciplinary team meeting -- Observation Level : q15 minute checks -- Vital signs:  q12 hours -- Precautions: suicide   I certify that inpatient services furnished can reasonably be expected to improve the patient's condition.   Donnice FORBES Right, PA-C 07/10/2024, 4:23 PM

## 2024-07-10 NOTE — Group Note (Signed)
 Recreation Therapy Group Note   Group Topic:Coping Skills  Group Date: 07/10/2024 Start Time: 1530 End Time: 1620 Facilitators: Celestia Jeoffrey FORBES ARTICE, CTRS Location: Craft Room  Group Description: Coping A-Z. LRT and patients engage in a guided discussion on what coping skills are and gave specific examples. LRT passed out a handout labeled Coping A-Z with blank spaces beside each letter. LRT prompted patients to come up with a coping skill for each of the letters. LRT and patients went over the handout and gave ideas for each letter if anyone had any blanks left on their paper. Patients kept this handout with them that listed 26 different coping skills.   Goal Area(s) Addressed: Patients will be able to define "coping skills". Patient will identify new coping skills.  Patient will increase communication.   Affect/Mood: N/A   Participation Level: Did not attend    Clinical Observations/Individualized Feedback: Patient did not attend group.   Plan: Continue to engage patient in RT group sessions 2-3x/week.   Jeoffrey FORBES Celestia, LRT, CTRS 07/10/2024 4:58 PM
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
## 2024-07-10 NOTE — Plan of Care (Signed)
  Problem: Education: Goal: Emotional status will improve Outcome: Progressing   Problem: Activity: Goal: Interest or engagement in activities will improve Outcome: Progressing   Problem: Coping: Goal: Ability to verbalize frustrations and anger appropriately will improve Outcome: Progressing   Problem: Safety: Goal: Periods of time without injury will increase Outcome: Progressing

## 2024-07-10 NOTE — Group Note (Signed)
 [facility] LCSW Group Therapy Note   Group Date: 07/10/2024 Start Time: 1300 End Time: 1345  Type of Therapy/Topic:  Group Therapy:  Feelings about Diagnosis  Participation Level:  Did Not Attend    Description of Group:    This group will allow patients to explore their thoughts and feelings about diagnoses they have received. Patients will be guided to explore their level of understanding and acceptance of these diagnoses. Facilitator will encourage patients to process their thoughts and feelings about the reactions of others to their diagnosis, and will guide patients in identifying ways to discuss their diagnosis with significant others in their lives. This group will be process-oriented, with patients participating in exploration of their own experiences as well as giving and receiving support and challenge from other group members.   Therapeutic Goals: 1. Patient will demonstrate understanding of diagnosis as evidence by identifying two or more symptoms of the disorder:  2. Patient will be able to express two feelings regarding the diagnosis 3. Patient will demonstrate ability to communicate their needs through discussion and/or role plays  Summary of Patient Progress: X   Therapeutic Modalities:   Cognitive Behavioral Therapy Brief Therapy Feelings Identification    Nadara JONELLE Fam, LCSW

## 2024-07-10 NOTE — Group Note (Signed)
 Recreation Therapy Group Note   Group Topic:General Recreation  Group Date: 07/10/2024 Start Time: 1000 End Time: 1110 Facilitators: Celestia Jeoffrey BRAVO, LRT, CTRS Location: Courtyard  Group Description: [employer]. LRT and patients played games of basketball, drew with chalk, and played corn hole while outside in the courtyard while getting fresh air and sunlight. Music was being played in the background. LRT and peers conversed about different games they have played before, what they do in their free time and anything else that is on their minds. LRT encouraged pts to drink water after being outside, sweating and getting their heart rate up.  Goal Area(s) Addressed: Patient will build on frustration tolerance skills. Patients will partake in a competitive play game with peers. Patients will gain knowledge of new leisure interest/hobby.    Affect/Mood: N/A   Participation Level: Did not attend    Clinical Observations/Individualized Feedback: Patient did not attend group.   Plan: Continue to engage patient in RT group sessions 2-3x/week.   [address], LRT, CTRS 07/10/2024 1:22 PM

## 2024-07-10 NOTE — Group Note (Signed)
 Date:  07/10/2024 Time:  11:38 PM  Group Topic/Focus:  Wrap-Up Group:   The focus of this group is to help patients review their daily goal of treatment and discuss progress on daily workbooks.    Participation Level:  Minimal  Participation Quality:  Appropriate and Attentive  Affect:  Appropriate  Cognitive:  Appropriate  Insight: Appropriate  Engagement in Group:  Limited  Modes of Intervention:  Discussion and Orientation  Additional Comments:     Arlester CHRISTELLA Servant 07/10/2024, 11:38 PM

## 2024-07-11 ENCOUNTER — Encounter: Payer: Self-pay | Admitting: Psychiatry

## 2024-07-11 DIAGNOSIS — F332 Major depressive disorder, recurrent severe without psychotic features: Secondary | ICD-10-CM | POA: Diagnosis not present
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
## 2024-07-11 NOTE — [facility] Suicide Risk Assessment (Signed)
 [facility] INPATIENT:  Family/Significant Other Suicide Prevention Education  Suicide Prevention Education:  Patient Refusal for Family/Significant Other Suicide Prevention Education: The patient Justin Neal has refused to provide written consent for family/significant other to be provided Family/Significant Other Suicide Prevention Education during admission and/or prior to discharge.  Physician notified.  SPE completed with pt, as pt refused to consent to family contact. SPI pamphlet provided to pt and pt was encouraged to share information with support network, ask questions, and talk about any concerns relating to SPE. Pt denies access to guns/firearms and verbalized understanding of information provided. Mobile Crisis information also provided to pt.   Sherryle JINNY Margo 07/11/2024, 7:40 PM

## 2024-07-11 NOTE — Group Note (Signed)
 Date:  07/11/2024 Time:  1:10 PM  Group Topic/Focus:  Building Self Esteem:   The Focus of this group is helping patients become aware of the effects of self-esteem on their lives, the things they and others do that enhance or undermine their self-esteem, seeing the relationship between their level of self-esteem and the choices they make and learning ways to enhance self-esteem.    Participation Level:  Did Not Attend   Justin Neal Hopes 07/11/2024, 1:10 PM

## 2024-07-11 NOTE — Plan of Care (Signed)

## 2024-07-11 NOTE — Group Note (Signed)
 LCSW Group Therapy Note   Group Date: 07/11/2024 Start Time: 1300 End Time: 1400   Type of Therapy and Topic:  Group Therapy: Challenging Core Beliefs  Participation Level:  Minimal  Description of Group:  Patients were educated about core beliefs and asked to identify one harmful core belief that they have. Patients were asked to explore from where those beliefs originate. Patients were asked to discuss how those beliefs make them feel and the resulting behaviors of those beliefs. They were then be asked if those beliefs are true and, if so, what evidence they have to support them. Lastly, group members were challenged to replace those negative core beliefs with helpful beliefs.   Therapeutic Goals:   1. Patient will identify harmful core beliefs and explore the origins of such beliefs. 2. Patient will identify feelings and behaviors that result from those core beliefs. 3. Patient will discuss whether such beliefs are true. 4.  Patient will replace harmful core beliefs with helpful ones.  Summary of Patient Progress:  Patient minimally engaged in processing and exploring how core beliefs are formed and how they impact thoughts, feelings, and behaviors. Patient proved open to input from peers and feedback from CSW. Patient demonstrated basic insight into the subject matter, was respectful and supportive of peers, and participated throughout the entire session.  Therapeutic Modalities: Cognitive Behavioral Therapy; Solution-Focused Therapy   Justin Neal, Justin Neal 07/11/2024  3:09 PM

## 2024-07-11 NOTE — Progress Notes (Signed)
 [facility] MD Progress Note  07/11/2024 9:24 AM Justin Neal  MRN:  [id number]   Subjective:  Chart reviewed, case discussed in multidisciplinary meeting, patient seen during rounds.   Patient seen for follow up they are alert and oriented. They report improved sleep. They not improved anxiety. Discussed that they have a history of HTN but are not interested in med management and prefer lifestyle change. Feels depression is improving rates at 7/10. Denies SI/HI/AVH. Is less irritable today, states he feels safe here and that has helped him calm down. Paranoia ad persecutory thoughts continue. Clarifies that he moved here from [place] 6 years ago and has never really settled in feels that people down here want to see him struggle and feel that he is targeted by others and that people do not see it.   Sleep: Good  Appetite:  Good  Past Psychiatric History: see h&P Family History: History reviewed. No pertinent family history. Social History:  Social History   Substance and Sexual Activity  Alcohol Use Not Currently     Social History   Substance and Sexual Activity  Drug Use Yes   Types: Marijuana, Cocaine    Social History   Socioeconomic History   Marital status: Married    Spouse name: Not on file   Number of children: Not on file   Years of education: Not on file   Highest education level: Not on file  Occupational History   Not on file  Tobacco Use   Smoking status: Every Day   Smokeless tobacco: Never  Vaping Use   Vaping status: Never Used  Substance and Sexual Activity   Alcohol use: Not Currently   Drug use: Yes    Types: Marijuana, Cocaine   Sexual activity: Not on file  Other Topics Concern   Not on file  Social History Narrative   Not on file   Social Drivers of Health   Financial Resource Strain: High Risk (01/03/2024)   Received from [facility]   Overall Financial Resource Strain (CARDIA)    Difficulty of Paying Living Expenses: Very hard  Food Insecurity:  Patient Declined (07/09/2024)   Hunger Vital Sign    Worried About Running Out of Food in the Last Year: Patient declined    Ran Out of Food in the Last Year: Patient declined  Transportation Needs: Unmet Transportation Needs (07/09/2024)   PRAPARE - Administrator, Civil Service (Medical): Yes    Lack of Transportation (Non-Medical): Yes  Physical Activity: Not on file  Stress: Not on file  Social Connections: Not on file   Past Medical History:  Past Medical History:  Diagnosis Date   Anxiety    Depression    PTSD (post-traumatic stress disorder)    Schizophrenia (HCC)    History reviewed. No pertinent surgical history.  Current Medications: Current Facility-Administered Medications  Medication Dose Route Frequency Provider Last Rate Last Admin   acetaminophen  (TYLENOL ) tablet 650 mg  650 mg Oral Q6H PRN Donnelly Mellow, MD       alum & mag hydroxide-simeth (MAALOX/MYLANTA) 200-200-20 MG/5ML suspension 30 mL  30 mL Oral Q4H PRN Jadapalle, Sree, MD       busPIRone  (BUSPAR ) tablet 10 mg  10 mg Oral BID Jadapalle, Sree, MD   10 mg at 07/11/24 9146   haloperidol  (HALDOL ) tablet 5 mg  5 mg Oral TID PRN Jadapalle, Sree, MD       And   diphenhydrAMINE  (BENADRYL ) capsule 50 mg  50 mg Oral TID PRN Jadapalle, Sree, MD       haloperidol  lactate (HALDOL ) injection 5 mg  5 mg Intramuscular TID PRN Jadapalle, Sree, MD       And   diphenhydrAMINE  (BENADRYL ) injection 50 mg  50 mg Intramuscular TID PRN Jadapalle, Sree, MD       And   LORazepam  (ATIVAN ) injection 2 mg  2 mg Intramuscular TID PRN Jadapalle, Sree, MD       haloperidol  lactate (HALDOL ) injection 10 mg  10 mg Intramuscular TID PRN Jadapalle, Sree, MD       And   diphenhydrAMINE  (BENADRYL ) injection 50 mg  50 mg Intramuscular TID PRN Jadapalle, Sree, MD       And   LORazepam  (ATIVAN ) injection 2 mg  2 mg Intramuscular TID PRN Jadapalle, Sree, MD       escitalopram  (LEXAPRO ) tablet 10 mg  10 mg Oral Daily Jadapalle, Sree,  MD   10 mg at 07/11/24 9146   guanFACINE  (TENEX ) tablet 1 mg  1 mg Oral QHS Jadapalle, Sree, MD   1 mg at 07/10/24 2129   hydrOXYzine  (ATARAX ) tablet 25 mg  25 mg Oral Q6H PRN Jadapalle, Sree, MD       magnesium  hydroxide (MILK OF MAGNESIA) suspension 30 mL  30 mL Oral Daily PRN Jadapalle, Sree, MD       OLANZapine  (ZYPREXA ) tablet 10 mg  10 mg Oral QHS Jadapalle, Sree, MD   10 mg at 07/10/24 2129   traZODone  (DESYREL ) tablet 50 mg  50 mg Oral QHS PRN Jadapalle, Sree, MD   50 mg at 07/09/24 2119    Lab Results: No results found for this or any previous visit (from the past 48 hours).  Blood Alcohol level:  Lab Results  Component Value Date   [facility] <15 07/08/2024   ETH <10 03/14/2024    Metabolic Disorder Labs: No results found for: HGBA1C, MPG No results found for: PROLACTIN No results found for: CHOL, TRIG, HDL, CHOLHDL, VLDL, LDLCALC  Physical Findings: AIMS:  , ,  ,  ,    CIWA:    COWS:      Psychiatric Specialty Exam:  Presentation  General Appearance:  Casual  Eye Contact: Fair  Speech: Clear and Coherent  Speech Volume: Increased    Mood and Affect  Mood: Dysphoric; Labile; Irritable  Affect: Blunt   Thought Process  Thought Processes: Disorganized  Descriptions of Associations:Tangential  Orientation:Full (Time, Place and Person)  Thought Content:Paranoid Ideation  Hallucinations:Hallucinations: None  Ideas of Reference:Percusatory; Paranoia  Suicidal Thoughts:Suicidal Thoughts: No  Homicidal Thoughts:Homicidal Thoughts: Yes, Passive   Sensorium  Memory: Immediate Fair  Judgment: Impaired  Insight: Lacking   Executive Functions  Concentration: Poor  Attention Span: Fair  Recall: Poor  Fund of Knowledge: Fair  Language: Fair   Psychomotor Activity  Psychomotor Activity: Psychomotor Activity: Normal  Musculoskeletal: Strength & Muscle Tone: within normal limits Gait & Station: normal Assets   Assets: Manufacturing systems engineer; Desire for Improvement    Physical Exam: Physical Exam Vitals and nursing note reviewed.  HENT:     Head: Atraumatic.  Eyes:     Extraocular Movements: Extraocular movements intact.  Pulmonary:     Effort: Pulmonary effort is normal.  Neurological:     Mental Status: He is alert and oriented to person, place, and time.    Review of Systems  Psychiatric/Behavioral:  Positive for depression. Negative for hallucinations and suicidal ideas. The patient is nervous/anxious. The patient does  not have insomnia.    Blood pressure 133/87, pulse (!) 51, temperature (!) 97.3 F (36.3 C), resp. rate 19, height 5' 11 (1.803 m), weight 95.3 kg, SpO2 99%. Body mass index is 29.29 kg/m.  Diagnosis: Principal Problem:   MDD (major depressive disorder), recurrent severe, without psychosis (HCC)   PLAN: Safety and Monitoring:  -- Voluntary admission to inpatient psychiatric unit for safety, stabilization and treatment  -- Daily contact with patient to assess and evaluate symptoms and progress in treatment  -- Patient's case to be discussed in multi-disciplinary team meeting  -- Observation Level : q15 minute checks  -- Vital signs:  q12 hours  -- Precautions: suicide, elopement, and assault -- Encouraged patient to participate in unit milieu and in scheduled group therapies  2. Psychiatric Diagnoses and Treatment:   Schizoaffective disorder Anxiety  Depression  Polysubstance use  PTSD Has ongoing depression and paranoia. Does not appear internally preoccupied and denies hallucinations. He is tangential at times, but is linear today. Patient continues to require inpatient psychiatric hospitalization due to impaired judgement, risk of harm to self and others. Given multiple hospitalizations this year pt would benefit from ACT services, this is complicated by homelessness. Does not consent to Collateral.   Continue BusPar  10 mg BID Lexapro  10 mg daily Tenex  1  mg daily Zyprexa  10 mg nightly -- The risks/benefits/side-effects/alternatives to this medication were discussed in detail with the patient and time was given for questions. The patient consents to medication trial.                -- Metabolic profile and EKG monitoring obtained while on an atypical antipsychotic (BMI: Lipid Panel: HbgA1c: QTc:)              -- Encouraged patient to participate in unit milieu and in scheduled group therapies                                  3. Medical Issues Being Addressed:  Discussed HTN and he indicates he prefers lifestyle change over med management.    4. Discharge Planning:   -- Social work and case management to assist with discharge planning and identification of hospital follow-up needs prior to discharge  -- Estimated LOS: 3-4 days  Donnice FORBES Right, PA-C 07/11/2024, 9:24 AM

## 2024-07-11 NOTE — BH IP Treatment Plan (Signed)
 Interdisciplinary Treatment and Diagnostic Plan Update  07/11/2024 Time of Session: 10:24 AM Justin Neal MRN: [id number]  Principal Diagnosis: MDD (major depressive disorder), recurrent severe, without psychosis (HCC)  Secondary Diagnoses: Principal Problem:   MDD (major depressive disorder), recurrent severe, without psychosis (HCC)   Current Medications:  Current Facility-Administered Medications  Medication Dose Route Frequency Provider Last Rate Last Admin   acetaminophen  (TYLENOL ) tablet 650 mg  650 mg Oral Q6H PRN Jadapalle, Sree, MD       alum & mag hydroxide-simeth (MAALOX/MYLANTA) 200-200-20 MG/5ML suspension 30 mL  30 mL Oral Q4H PRN Jadapalle, Sree, MD       busPIRone  (BUSPAR ) tablet 10 mg  10 mg Oral BID Jadapalle, Sree, MD   10 mg at 07/11/24 9146   haloperidol  (HALDOL ) tablet 5 mg  5 mg Oral TID PRN Jadapalle, Sree, MD       And   diphenhydrAMINE  (BENADRYL ) capsule 50 mg  50 mg Oral TID PRN Jadapalle, Sree, MD       haloperidol  lactate (HALDOL ) injection 5 mg  5 mg Intramuscular TID PRN Jadapalle, Sree, MD       And   diphenhydrAMINE  (BENADRYL ) injection 50 mg  50 mg Intramuscular TID PRN Jadapalle, Sree, MD       And   LORazepam  (ATIVAN ) injection 2 mg  2 mg Intramuscular TID PRN Jadapalle, Sree, MD       haloperidol  lactate (HALDOL ) injection 10 mg  10 mg Intramuscular TID PRN Jadapalle, Sree, MD       And   diphenhydrAMINE  (BENADRYL ) injection 50 mg  50 mg Intramuscular TID PRN Jadapalle, Sree, MD       And   LORazepam  (ATIVAN ) injection 2 mg  2 mg Intramuscular TID PRN Jadapalle, Sree, MD       escitalopram  (LEXAPRO ) tablet 10 mg  10 mg Oral Daily Jadapalle, Sree, MD   10 mg at 07/11/24 9146   guanFACINE  (TENEX ) tablet 1 mg  1 mg Oral QHS Jadapalle, Sree, MD   1 mg at 07/10/24 2129   hydrOXYzine  (ATARAX ) tablet 25 mg  25 mg Oral Q6H PRN Jadapalle, Sree, MD       magnesium  hydroxide (MILK OF MAGNESIA) suspension 30 mL  30 mL Oral Daily PRN Jadapalle, Sree, MD        OLANZapine  (ZYPREXA ) tablet 10 mg  10 mg Oral QHS Jadapalle, Sree, MD   10 mg at 07/10/24 2129   traZODone  (DESYREL ) tablet 50 mg  50 mg Oral QHS PRN Jadapalle, Sree, MD   50 mg at 07/09/24 2119   PTA Medications: Medications Prior to Admission  Medication Sig Dispense Refill Last Dose/Taking   busPIRone  (BUSPAR ) 10 MG tablet Take 10 mg by mouth 2 (two) times daily.      escitalopram  (LEXAPRO ) 10 MG tablet Take 15 mg by mouth daily.      melatonin 3 MG TABS tablet Take 3 mg by mouth at bedtime.      nicotine (NICODERM CQ - DOSED IN MG/24 HOURS) 21 mg/24hr patch Place 21 mg onto the skin daily.      OLANZapine  (ZYPREXA ) 5 MG tablet Take 5 mg by mouth in the morning and 15 mg at bedtime.       Patient Stressors: Financial difficulties   Other: Pt homeless and extrememly angry; [name] because pt unwilling to participate    Patient Strengths: Other: unable to assess  Treatment Modalities: Medication Management, Group therapy, Case management,  1 to 1 session with clinician,  Psychoeducation, Recreational therapy.   Physician Treatment Plan for Primary Diagnosis: MDD (major depressive disorder), recurrent severe, without psychosis (HCC) Long Term Goal(s): Improvement in symptoms so as ready for discharge   Short Term Goals: Ability to demonstrate self-control will improve Ability to identify and develop effective coping behaviors will improve Ability to maintain clinical measurements within normal limits will improve Compliance with prescribed medications will improve Ability to identify triggers associated with substance abuse/mental health issues will improve  Medication Management: Evaluate patient's response, side effects, and tolerance of medication regimen.  Therapeutic Interventions: 1 to 1 sessions, Unit Group sessions and Medication administration.  Evaluation of Outcomes: Not Met  Physician Treatment Plan for Secondary Diagnosis: Principal Problem:   MDD (major depressive  disorder), recurrent severe, without psychosis (HCC)  Long Term Goal(s): Improvement in symptoms so as ready for discharge   Short Term Goals: Ability to demonstrate self-control will improve Ability to identify and develop effective coping behaviors will improve Ability to maintain clinical measurements within normal limits will improve Compliance with prescribed medications will improve Ability to identify triggers associated with substance abuse/mental health issues will improve     Medication Management: Evaluate patient's response, side effects, and tolerance of medication regimen.  Therapeutic Interventions: 1 to 1 sessions, Unit Group sessions and Medication administration.  Evaluation of Outcomes: Not Met   RN Treatment Plan for Primary Diagnosis: MDD (major depressive disorder), recurrent severe, without psychosis (HCC) Long Term Goal(s): Knowledge of disease and therapeutic regimen to maintain health will improve  Short Term Goals: Ability to verbalize frustration and anger appropriately will improve, Ability to demonstrate self-control, Ability to participate in decision making will improve, Ability to verbalize feelings will improve, Ability to disclose and discuss suicidal ideas, and Ability to identify and develop effective coping behaviors will improve  Medication Management: RN will administer medications as ordered by provider, will assess and evaluate patient's response and provide education to patient for prescribed medication. RN will report any adverse and/or side effects to prescribing provider.  Therapeutic Interventions: 1 on 1 counseling sessions, Psychoeducation, Medication administration, Evaluate responses to treatment, Monitor vital signs and CBGs as ordered, Perform/monitor CIWA, COWS, AIMS and Fall Risk screenings as ordered, Perform wound care treatments as ordered.  Evaluation of Outcomes: Not Met   LCSW Treatment Plan for Primary Diagnosis: MDD (major  depressive disorder), recurrent severe, without psychosis (HCC) Long Term Goal(s): Safe transition to appropriate next level of care at discharge, Engage patient in therapeutic group addressing interpersonal concerns.  Short Term Goals: Engage patient in aftercare planning with referrals and resources, Increase social support, Increase ability to appropriately verbalize feelings, Increase emotional regulation, Facilitate acceptance of mental health diagnosis and concerns, Facilitate patient progression through stages of change regarding substance use diagnoses and concerns, Identify triggers associated with mental health/substance abuse issues, and Increase skills for wellness and recovery  Therapeutic Interventions: Assess for all discharge needs, 1 to 1 time with Social worker, Explore available resources and support systems, Assess for adequacy in community support network, Educate family and significant other(s) on suicide prevention, Complete Psychosocial Assessment, Interpersonal group therapy.  Evaluation of Outcomes: Not Met   Progress in Treatment: Attending groups: Yes. and No. Participating in groups: Yes. and No. Taking medication as prescribed: Yes. Toleration medication: Yes. Family/Significant other contact made: No, will contact:  CSW to make contact once permission is granted.  Patient understands diagnosis: Yes. Discussing patient identified problems/goals with staff: Yes. Medical problems stabilized or resolved: Yes. Denies suicidal/homicidal ideation: Yes. Issues/concerns  per patient self-inventory: No. Other: None  New problem(s) identified: No, Describe:  None  New Short Term/Long Term Goal(s):detox, elimination of symptoms of psychosis, medication management for mood stabilization; elimination of SI thoughts; development of comprehensive mental wellness/sobriety plan.    Patient Goals:  To get help and get treatment.  Discharge Plan or Barriers: CSW to assist  with the development of appropriate discharge plan.    Reason for Continuation of Hospitalization: Aggression Anxiety Delusions  Depression Mania Suicidal ideation Withdrawal symptoms  Estimated Length of Stay: 1-7 days.   Last 3 [place] Suicide Severity Risk Score: Flowsheet Row Admission (Current) from 07/09/2024 in [facility] ED from 07/08/2024 in [facility] Emergency Department at [facility] ED from 03/14/2024 in [facility] Emergency Department at [facility]  C-SSRS RISK CATEGORY High Risk High Risk No Risk    Last PHQ 2/9 Scores:     No data to display          Scribe for Treatment Team: Samanta Gal M Gianluca Chhim, LCSW 07/11/2024 10:56 AM

## 2024-07-11 NOTE — Plan of Care (Signed)
   Problem: Education: Goal: Emotional status will improve Outcome: Progressing Goal: Mental status will improve Outcome: Progressing

## 2024-07-11 NOTE — Group Note (Signed)
 Date:  07/11/2024 Time:  4:12 PM  Group Topic/Focus:  Activity Group: The focus of the group is to promote activity for the patients and encourage them to go outside to the courtyard and get some fresh air and some exercise.    Participation Level:  Did Not Attend   Camellia HERO Markiesha Delia 07/11/2024, 4:12 PM

## 2024-07-11 NOTE — Progress Notes (Signed)
   07/11/24 1800  Psych Admission Type (Psych Patients Only)  Admission Status Voluntary  Psychosocial Assessment  Patient Complaints None (patient stated I'm alright druing initial assessment.)  Eye Contact Fair  Facial Expression Anxious;Worried  Affect Appropriate to circumstance  Surveyor, minerals Activity Slow  Appearance/Hygiene In scrubs  Behavior Characteristics Cooperative;Appropriate to situation  Mood Pleasant  Aggressive Behavior  Effect No apparent injury  Thought Process  Coherency WDL  Content Blaming others (patient stated during treatmen team that he wants somebody to actually help him and not to keep throwing him back out to the street with no resources.)  Delusions None reported or observed  Perception WDL  Hallucination None reported or observed  Judgment WDL  Confusion None  Danger to Self  Current suicidal ideation? Denies  Self-Injurious Behavior No self-injurious ideation or behavior indicators observed or expressed   Agreement Not to Harm Self Yes  Description of Agreement Verbal  Danger to Others  Danger to Others None reported or observed  Danger to Others Abnormal  Harmful Behavior to others No threats or harm toward other people  Destructive Behavior No threats or harm toward property   Patient's goal for treatment is to get help, get treatment, get in the right place, I'm tired of dealing with mental health issues. This ain't the life that God put us  here to live.

## 2024-07-11 NOTE — Group Note (Signed)
 Date:  07/11/2024 Time:  9:08 PM  Group Topic/Focus:  Goals Group:   The focus of this group is to help patients establish daily goals to achieve during treatment and discuss how the patient can incorporate goal setting into their daily lives to aide in recovery.    Participation Level:  Active  Participation Quality:  Appropriate  Affect:  Appropriate  Cognitive:  Appropriate  Insight: Appropriate  Engagement in Group:  Engaged  Modes of Intervention:  Activity  Additional Comments:    Justin Neal 07/11/2024, 9:08 PM

## 2024-07-12 DIAGNOSIS — F332 Major depressive disorder, recurrent severe without psychotic features: Secondary | ICD-10-CM | POA: Diagnosis not present

## 2024-07-12 NOTE — Plan of Care (Signed)
   Problem: Education: Goal: Emotional status will improve Outcome: Progressing Goal: Mental status will improve Outcome: Progressing   Problem: Activity: Goal: Interest or engagement in activities will improve Outcome: Progressing Goal: Sleeping patterns will improve Outcome: Progressing   Problem: Safety: Goal: Periods of time without injury will increase Outcome: Progressing

## 2024-07-12 NOTE — Plan of Care (Signed)

## 2024-07-12 NOTE — Progress Notes (Signed)
   07/12/24 0145  Psych Admission Type (Psych Patients Only)  Admission Status Voluntary  Psychosocial Assessment  Patient Complaints None  Eye Contact Fair  Facial Expression Flat  Affect Appropriate to circumstance  Speech Logical/coherent  Interaction Assertive  Motor Activity Slow  Appearance/Hygiene In scrubs  Behavior Characteristics Cooperative;Appropriate to situation  Mood Pleasant  Thought Process  Coherency WDL  Content WDL  Delusions None reported or observed  Perception WDL  Hallucination None reported or observed  Judgment WDL  Confusion None  Danger to Self  Current suicidal ideation? Denies  Self-Injurious Behavior No self-injurious ideation or behavior indicators observed or expressed   Agreement Not to Harm Self Yes  Description of Agreement Verbalized  Danger to Others  Danger to Others None reported or observed  Danger to Others Abnormal  Harmful Behavior to others No threats or harm toward other people  Destructive Behavior No threats or harm toward property

## 2024-07-12 NOTE — Progress Notes (Signed)
 [facility] MD Progress Note  07/12/2024 3:35 PM Justin Neal  MRN:  [id number]   Subjective:  Chart reviewed, case discussed in multidisciplinary meeting, patient seen during rounds.   8/14: Patient seen for follow-up.  They are alert and oriented.  They are pleasant and cooperative.  They report improved sleep, appetite, and mood.  They are linear and logical on exam.  Continues to continue to have some persecutory thoughts.  They deny SI, HI, and AVH.  They rated depression at 6 out of 10.  They report they feel less irritable today.  They report they have been able to participate in group in the unit milieu.  They were excepted at path of [facility] for Monday and are agreeable with the plan to discharge there for follow-up.  Had also discussed ACT services and will place that referral as well.  They voiced no concerns or complaints at this time.  8/13: Patient seen for follow up they are alert and oriented. They report improved sleep. They not improved anxiety. Discussed that they have a history of HTN but are not interested in med management and prefer lifestyle change. Feels depression is improving rates at 7/10. Denies SI/HI/AVH. Is less irritable today, states he feels safe here and that has helped him calm down. Paranoia ad persecutory thoughts continue. Clarifies that he moved here from [place] 6 years ago and has never really settled in feels that people down here want to see him struggle and feel that he is targeted by others and that people do not see it.   Sleep: Good  Appetite:  Good  Past Psychiatric History: see h&P Family History: History reviewed. No pertinent family history. Social History:  Social History   Substance and Sexual Activity  Alcohol Use Not Currently     Social History   Substance and Sexual Activity  Drug Use Yes   Types: Marijuana, Cocaine    Social History   Socioeconomic History   Marital status: Married    Spouse name: Not on file   Number of children: Not on  file   Years of education: Not on file   Highest education level: Not on file  Occupational History   Not on file  Tobacco Use   Smoking status: Every Day   Smokeless tobacco: Never  Vaping Use   Vaping status: Never Used  Substance and Sexual Activity   Alcohol use: Not Currently   Drug use: Yes    Types: Marijuana, Cocaine   Sexual activity: Not on file  Other Topics Concern   Not on file  Social History Narrative   Not on file   Social Drivers of Health   Financial Resource Strain: High Risk (01/03/2024)   Received from [facility]   Overall Financial Resource Strain (CARDIA)    Difficulty of Paying Living Expenses: Very hard  Food Insecurity: Patient Declined (07/09/2024)   Hunger Vital Sign    Worried About Running Out of Food in the Last Year: Patient declined    Ran Out of Food in the Last Year: Patient declined  Transportation Needs: Unmet Transportation Needs (07/09/2024)   PRAPARE - Administrator, Civil Service (Medical): Yes    Lack of Transportation (Non-Medical): Yes  Physical Activity: Not on file  Stress: Not on file  Social Connections: Not on file   Past Medical History:  Past Medical History:  Diagnosis Date   Anxiety    Depression    PTSD (post-traumatic stress disorder)  Schizophrenia (HCC)    History reviewed. No pertinent surgical history.  Current Medications: Current Facility-Administered Medications  Medication Dose Route Frequency Provider Last Rate Last Admin   acetaminophen  (TYLENOL ) tablet 650 mg  650 mg Oral Q6H PRN Jadapalle, Sree, MD       alum & mag hydroxide-simeth (MAALOX/MYLANTA) 200-200-20 MG/5ML suspension 30 mL  30 mL Oral Q4H PRN Jadapalle, Sree, MD       busPIRone  (BUSPAR ) tablet 10 mg  10 mg Oral BID Jadapalle, Sree, MD   10 mg at 07/12/24 0913   haloperidol  (HALDOL ) tablet 5 mg  5 mg Oral TID PRN Jadapalle, Sree, MD       And   diphenhydrAMINE  (BENADRYL ) capsule 50 mg  50 mg Oral TID PRN Jadapalle, Sree,  MD       haloperidol  lactate (HALDOL ) injection 5 mg  5 mg Intramuscular TID PRN Jadapalle, Sree, MD       And   diphenhydrAMINE  (BENADRYL ) injection 50 mg  50 mg Intramuscular TID PRN Jadapalle, Sree, MD       And   LORazepam  (ATIVAN ) injection 2 mg  2 mg Intramuscular TID PRN Jadapalle, Sree, MD       haloperidol  lactate (HALDOL ) injection 10 mg  10 mg Intramuscular TID PRN Jadapalle, Sree, MD       And   diphenhydrAMINE  (BENADRYL ) injection 50 mg  50 mg Intramuscular TID PRN Jadapalle, Sree, MD       And   LORazepam  (ATIVAN ) injection 2 mg  2 mg Intramuscular TID PRN Jadapalle, Sree, MD       escitalopram  (LEXAPRO ) tablet 10 mg  10 mg Oral Daily Jadapalle, Sree, MD   10 mg at 07/12/24 0913   guanFACINE  (TENEX ) tablet 1 mg  1 mg Oral QHS Jadapalle, Sree, MD   1 mg at 07/10/24 2129   hydrOXYzine  (ATARAX ) tablet 25 mg  25 mg Oral Q6H PRN Jadapalle, Sree, MD       magnesium  hydroxide (MILK OF MAGNESIA) suspension 30 mL  30 mL Oral Daily PRN Jadapalle, Sree, MD       OLANZapine  (ZYPREXA ) tablet 10 mg  10 mg Oral QHS Jadapalle, Sree, MD   10 mg at 07/10/24 2129   traZODone  (DESYREL ) tablet 50 mg  50 mg Oral QHS PRN Jadapalle, Sree, MD   50 mg at 07/09/24 2119    Lab Results: No results found for this or any previous visit (from the past 48 hours).  Blood Alcohol level:  Lab Results  Component Value Date   [facility] <15 07/08/2024   ETH <10 03/14/2024    Metabolic Disorder Labs: No results found for: HGBA1C, MPG No results found for: PROLACTIN No results found for: CHOL, TRIG, HDL, CHOLHDL, VLDL, LDLCALC  Physical Findings: AIMS:  , ,  ,  ,    CIWA:    COWS:      Psychiatric Specialty Exam:  Presentation  General Appearance:  Casual  Eye Contact: Fair  Speech: Clear and Coherent  Speech Volume: Increased    Mood and Affect  Mood: Dysphoric; Labile; Irritable  Affect: Blunt   Thought Process  Thought Processes: Disorganized  Descriptions of  Associations:Tangential  Orientation:Full (Time, Place and Person)  Thought Content:Paranoid Ideation  Hallucinations:No data recorded  Ideas of Reference:Percusatory; Paranoia  Suicidal Thoughts:No data recorded  Homicidal Thoughts:No data recorded   Sensorium  Memory: Immediate Fair  Judgment: Impaired  Insight: Lacking   Executive Functions  Concentration: Poor  Attention Span: Fair  Recall:  Poor  Fund of Knowledge: Fair  Language: Fair   Psychomotor Activity  Psychomotor Activity: No data recorded  Musculoskeletal: Strength & Muscle Tone: within normal limits Gait & Station: normal Assets  Assets: Manufacturing systems engineer; Desire for Improvement    Physical Exam: Physical Exam Vitals and nursing note reviewed.  HENT:     Head: Atraumatic.  Eyes:     Extraocular Movements: Extraocular movements intact.  Pulmonary:     Effort: Pulmonary effort is normal.  Neurological:     Mental Status: He is alert and oriented to person, place, and time.    Review of Systems  Psychiatric/Behavioral:  Positive for depression. Negative for hallucinations and suicidal ideas. The patient is nervous/anxious. The patient does not have insomnia.    Blood pressure (!) 122/96, pulse 71, temperature (!) 97.2 F (36.2 C), resp. rate 18, height 5' 11 (1.803 m), weight 95.3 kg, SpO2 100%. Body mass index is 29.29 kg/m.  Diagnosis: Principal Problem:   MDD (major depressive disorder), recurrent severe, without psychosis (HCC)   PLAN: Safety and Monitoring:  -- Voluntary admission to inpatient psychiatric unit for safety, stabilization and treatment  -- Daily contact with patient to assess and evaluate symptoms and progress in treatment  -- Patient's case to be discussed in multi-disciplinary team meeting  -- Observation Level : q15 minute checks  -- Vital signs:  q12 hours  -- Precautions: suicide, elopement, and assault -- Encouraged patient to participate  in unit milieu and in scheduled group therapies  2. Psychiatric Diagnoses and Treatment:   Schizoaffective disorder Anxiety  Depression  Polysubstance use  PTSD Patient is improving.  He has been accepted to treatment.  He is medication compliant.  No PRNs overnight.  No med adjustments today.  Continue BusPar  10 mg BID Lexapro  10 mg daily Tenex  1 mg daily Zyprexa  10 mg nightly -- The risks/benefits/side-effects/alternatives to this medication were discussed in detail with the patient and time was given for questions. The patient consents to medication trial.                -- Metabolic profile and EKG monitoring obtained while on an atypical antipsychotic (BMI: Lipid Panel: HbgA1c: QTc:)              -- Encouraged patient to participate in unit milieu and in scheduled group therapies                               3. Medical Issues Being Addressed:  Discussed HTN and he indicates he prefers lifestyle change over med management.    4. Discharge Planning:   -- Social work and case management to assist with discharge planning and identification of hospital follow-up needs prior to discharge  -- Estimated LOS: 3-4 days  Donnice FORBES Right, PA-C 07/12/2024, 3:35 PM

## 2024-07-12 NOTE — Group Note (Signed)
 Recreation Therapy Group Note   Group Topic:General Recreation  Group Date: 07/12/2024 Start Time: 1000 End Time: 1100 Facilitators: Celestia Jeoffrey BRAVO, LRT, CTRS Location: Courtyard  Group Description: [employer]. LRT and patients played games of basketball, drew with chalk, and played corn hole while outside in the courtyard while getting fresh air and sunlight. Music was being played in the background. LRT and peers conversed about different games they have played before, what they do in their free time and anything else that is on their minds. LRT encouraged pts to drink water after being outside, sweating and getting their heart rate up.  Goal Area(s) Addressed: Patient will build on frustration tolerance skills. Patients will partake in a competitive play game with peers. Patients will gain knowledge of new leisure interest/hobby.    Affect/Mood: N/A   Participation Level: Did not attend    Clinical Observations/Individualized Feedback: Patient did not attend group.   Plan: Continue to engage patient in RT group sessions 2-3x/week.   Jeoffrey BRAVO Celestia, LRT, CTRS 07/12/2024 11:59 AM

## 2024-07-12 NOTE — Group Note (Signed)
 Date:  07/12/2024 Time:  2:46 PM  Group Topic/Focus:  Goals Group:   The focus of this group is to help patients establish daily goals to achieve during treatment and discuss how the patient can incorporate goal setting into their daily lives to aide in recovery.    Participation Level:  Active  Participation Quality:  Appropriate  Affect:  Appropriate  Cognitive:  Appropriate  Insight: Appropriate  Engagement in Group:  Engaged  Modes of Intervention:  Discussion, Education, and Support  Additional Comments:    Deitra Caron Mainland 07/12/2024, 2:46 PM

## 2024-07-12 NOTE — Group Note (Signed)
 Date:  07/12/2024 Time:  2:51 PM  Group Topic/Focus:  Early Warning Signs:   The focus of this group is to help patients identify signs or symptoms they exhibit before slipping into an unhealthy state or crisis. Identifying Needs:   The focus of this group is to help patients identify their personal needs that have been historically problematic and identify healthy behaviors to address their needs.    Participation Level:  Active  Participation Quality:  Appropriate  Affect:  Appropriate  Cognitive:  Appropriate  Insight: Appropriate  Engagement in Group:  Engaged  Modes of Intervention:  Exploration and Socialization  Additional Comments:    Justin Neal 07/12/2024, 2:51 PM

## 2024-07-12 NOTE — BHH Counselor (Signed)
 Patient has been accepted to [facility] for Monday 07/16/2024.  Patient will need a 30 day supply of medications, cigarettes if he smokes, a weeks worth of clothing and a physicians statement.  Sherryle Margo, MSW, LCSW 07/12/2024 11:35 AM

## 2024-07-12 NOTE — Progress Notes (Signed)
   07/12/24 1530  Psych Admission Type (Psych Patients Only)  Admission Status Voluntary  Psychosocial Assessment  Patient Complaints None (patient states I'm feeling good.)  Eye Contact Fair  Facial Expression Other (Comment) (WNL)  Affect Appropriate to circumstance  Speech Logical/coherent  Interaction Assertive  Motor Activity Slow  Appearance/Hygiene In scrubs  Behavior Characteristics Cooperative;Appropriate to situation  Mood Pleasant  Aggressive Behavior  Effect No apparent injury  Thought Process  Coherency WDL  Content WDL  Delusions None reported or observed  Perception WDL  Hallucination None reported or observed  Judgment WDL  Confusion None  Danger to Self  Current suicidal ideation? Denies  Self-Injurious Behavior No self-injurious ideation or behavior indicators observed or expressed   Agreement Not to Harm Self Yes  Description of Agreement Verbal  Danger to Others  Danger to Others None reported or observed  Danger to Others Abnormal  Harmful Behavior to others No threats or harm toward other people  Destructive Behavior No threats or harm toward property   Patient was in a much better mood today upon assessment stating I'm feeling good, I got up and got some good news this morning. It's lightening up, I needed that, regarding getting accepted into [facility]. Patient's stated goal for today, per his self-inventory is getting the right help for me, in which he will talk to my team in order to achieve his goal.

## 2024-07-12 NOTE — Group Note (Signed)
 Date:  07/12/2024 Time:  8:57 PM  Group Topic/Focus:  Personal Choices and Values:   The focus of this group is to help patients assess and explore the importance of values in their lives, how their values affect their decisions, how they express their values and what opposes their expression. Self Esteem Action Plan:   The focus of this group is to help patients create a plan to continue to build self-esteem after discharge.    Participation Level:  Active  Participation Quality:  Appropriate  Affect:  Appropriate  Cognitive:  Appropriate  Insight: Appropriate  Engagement in Group:  Engaged  Modes of Intervention:  Discussion  Additional Comments:    Justin Neal L 07/12/2024, 8:57 PM

## 2024-07-12 NOTE — BHH Counselor (Signed)
 CSW has sent referral to [facility].  CSW contacted [facility] to determine if patient's insurance is accepted.  CSW has provided patient with contact information to complete interview with [employer], [employer] and [employer].  CSW to complete referral for [facility].  Sherryle Margo, MSW, LCSW 07/12/2024 11:24 AM

## 2024-07-12 NOTE — Progress Notes (Signed)
 Pt calm and pleasant during assessment denying SI/HI/AVH. Pt compliant with medication administration per MD orders. Pt given education, support, and encouragement to be active in his treatment plan. Pt being monitored Q 15 minutes for safety per unit protocol, remains safe on the unit

## 2024-07-12 NOTE — Group Note (Signed)
 Recreation Therapy Group Note   Group Topic:Healthy Support Systems  Group Date: 07/12/2024 Start Time: 1530 End Time: 1615 Facilitators: Celestia Jeoffrey FORBES ARTICE, CTRS Location: Craft Room  Group Description: Straw Bridge. In groups or individually, patients were given 10 plastic drinking straws and an equal length of masking tape. Using the materials provided, patients were instructed to build a free-standing bridge-like structure to suspend an everyday item (ex: deck of cards) off the floor or table surface. All materials were required to be used in Secondary school teacher. LRT facilitated post-activity discussion reviewing the importance of having strong and healthy support systems in our lives. LRT discussed how the people in our lives serve as the tape and the deck of cards we placed on top of our straw structure are the stressors we face in daily life. LRT and pts discussed what happens in our life when things get too heavy for us , and we don't have strong supports outside of the hospital. Pt shared 2 of their healthy supports in their life aloud in the group.   Goal Area(s) Addressed:  Patient will identify 2 healthy supports in their life. Patient will identify skills to successfully complete activity. Patient will identify correlation of this activity to life post-discharge.  Patient will build on frustration tolerance skills. Patient will increase team building and communication skills.   Affect/Mood: Appropriate   Participation Level: Minimal    Clinical Observations/Individualized Feedback: Justin Neal came to group with 5 minutes remaining. Pt shared: meditating and praying as healthy supports.   Plan: Continue to engage patient in RT group sessions 2-3x/week.   Jeoffrey FORBES Celestia, LRT, CTRS 07/12/2024 5:43 PM

## 2024-07-12 NOTE — Group Note (Signed)
 LCSW Group Therapy Note   Group Date: 07/12/2024 Start Time: 0900 End Time: 1000   Type of Therapy and Topic:  Group Therapy: Boundaries  Participation Level:  Did Not Attend  Description of Group: This group will address the use of boundaries in their personal lives. Patients will explore why boundaries are important, the difference between healthy and unhealthy boundaries, and negative and postive outcomes of different boundaries and will look at how boundaries can be crossed.  Patients will be encouraged to identify current boundaries in their own lives and identify what kind of boundary is being set. Facilitators will guide patients in utilizing problem-solving interventions to address and correct types boundaries being used and to address when no boundary is being used. Understanding and applying boundaries will be explored and addressed for obtaining and maintaining a balanced life. Patients will be encouraged to explore ways to assertively make their boundaries and needs known to significant others in their lives, using other group members and facilitator for role play, support, and feedback.  Therapeutic Goals:  1.  Patient will identify areas in their life where setting clear boundaries could be  used to improve their life.  2.  Patient will identify signs/triggers that a boundary is not being respected. 3.  Patient will identify two ways to set boundaries in order to achieve balance in  their lives: 4.  Patient will demonstrate ability to communicate their needs and set boundaries  through discussion and/or role plays  Summary of Patient Progress:   X  Therapeutic Modalities:   Cognitive Behavioral Therapy Solution-Focused Therapy  Sherryle JINNY Margo, LCSW 07/12/2024  10:39 AM

## 2024-07-13 ENCOUNTER — Other Ambulatory Visit: Payer: Self-pay

## 2024-07-13 DIAGNOSIS — F332 Major depressive disorder, recurrent severe without psychotic features: Secondary | ICD-10-CM | POA: Diagnosis not present

## 2024-07-13 MED ORDER — NICOTINE 21 MG/24HR TD PT24
21.0000 mg | MEDICATED_PATCH | Freq: Every day | TRANSDERMAL | Status: DC
  Administered 2024-07-13 – 2024-07-15 (×3): 21 mg via TRANSDERMAL
  Filled 2024-07-13 (×3): qty 1

## 2024-07-13 MED ORDER — BUSPIRONE HCL 10 MG PO TABS
10.0000 mg | ORAL_TABLET | Freq: Two times a day (BID) | ORAL | 0 refills | Status: AC
  Filled 2024-07-13: qty 60, 30d supply, fill #0

## 2024-07-13 MED ORDER — ESCITALOPRAM OXALATE 10 MG PO TABS
10.0000 mg | ORAL_TABLET | Freq: Every day | ORAL | 0 refills | Status: AC
  Filled 2024-07-13: qty 30, 30d supply, fill #0

## 2024-07-13 MED ORDER — OLANZAPINE 10 MG PO TABS
10.0000 mg | ORAL_TABLET | Freq: Every day | ORAL | 0 refills | Status: AC
  Filled 2024-07-13: qty 30, 30d supply, fill #0

## 2024-07-13 NOTE — Group Note (Signed)
 Recreation Therapy Group Note   Group Topic:Leisure Education  Group Date: 07/13/2024 Start Time: 1530 End Time: 1635 Facilitators: Celestia Jeoffrey FORBES ARTICE, CTRS Location: Craft Room  Group Description: Leisure. Patients were given the option to choose from singing karaoke, coloring mandalas, using oil pastels, journaling, painting or playing with play-doh. LRT and pts discussed the meaning of leisure, the importance of participating in leisure during their free time/when they're outside of the hospital, as well as how our leisure interests can also serve as coping skills.   Goal Area(s) Addressed:  Patient will identify a current leisure interest.  Patient will learn the definition of "leisure". Patient will practice making a positive decision. Patient will have the opportunity to try a new leisure activity. Patient will communicate with peers and LRT.    Affect/Mood: N/A   Participation Level: Did not attend    Clinical Observations/Individualized Feedback: Patient did not attend group.   Plan: Continue to engage patient in RT group sessions 2-3x/week.   [address] LRT, CTRS 07/13/2024 5:25 PM

## 2024-07-13 NOTE — Group Note (Signed)
 Recreation Therapy Group Note   Group Topic:Health and Wellness  Group Date: 07/13/2024 Start Time: 1020 End Time: 1120 Facilitators: Celestia Jeoffrey BRAVO, LRT, CTRS Location: Courtyard  Group Description: [employer]. LRT and patients played games of basketball, drew with chalk, and played corn hole while outside in the courtyard while getting fresh air and sunlight. Music was being played in the background. LRT and peers conversed about different games they have played before, what they do in their free time and anything else that is on their minds. LRT encouraged pts to drink water after being outside, sweating and getting their heart rate up.  Goal Area(s) Addressed: Patient will build on frustration tolerance skills. Patients will partake in a competitive play game with peers. Patients will gain knowledge of new leisure interest/hobby.    Affect/Mood: N/A   Participation Level: Did not attend    Clinical Observations/Individualized Feedback: Patient did not attend group.   Plan: Continue to engage patient in RT group sessions 2-3x/week.   Jeoffrey BRAVO Celestia, LRT, CTRS 07/13/2024 11:37 AM

## 2024-07-13 NOTE — Plan of Care (Signed)
   Problem: Education: Goal: Emotional status will improve Outcome: Progressing Goal: Mental status will improve Outcome: Progressing

## 2024-07-13 NOTE — Progress Notes (Signed)
   07/13/24 0853  Psych Admission Type (Psych Patients Only)  Admission Status Voluntary  Psychosocial Assessment  Patient Complaints None  Eye Contact Fair;Avoids (avoids when aggitated)  Facial Expression Animated  Affect Appropriate to circumstance  Speech Logical/coherent  Interaction Assertive  Motor Activity Slow  Appearance/Hygiene Unremarkable  Behavior Characteristics Cooperative  Mood Angry  Aggressive Behavior  Effect No apparent injury  Thought Process  Coherency WDL  Content [name] (pt refused to answer assessment questions)  Delusions None reported or observed  Perception [name]  Hallucination None reported or observed  Judgment [name]  Confusion None  Danger to Self  Current suicidal ideation?  (Unknown; pt refused to answer citing, Man its too early for this; I just woke up)  Self-Injurious Behavior No self-injurious ideation or behavior indicators observed or expressed   Danger to Others  Danger to Others None reported or observed  Danger to Others Abnormal  Harmful Behavior to others No threats or harm toward other people  Destructive Behavior No threats or harm toward property

## 2024-07-13 NOTE — Progress Notes (Signed)
 [facility] MD Progress Note  07/13/2024 3:52 PM Justin Neal  MRN:  [id number]   Subjective:  Chart reviewed, case discussed in multidisciplinary meeting, patient seen during rounds.   8/15: Patient seen for follow-up today.  They are alert and oriented.  They are pleasant and cooperative on exam.  They have been accepted at [facility] for Monday.  We have also referred for ACT services.  Discussed that we will discontinue guanfacine  today given the off label use and patient's desire to minimize medication list.  Patient notes stable mood appetite and sleep.  He voices no concerns or complaints at this time.  8/14: Patient seen for follow-up.  They are alert and oriented.  They are pleasant and cooperative.  They report improved sleep, appetite, and mood.  They are linear and logical on exam.  Continues to continue to have some persecutory thoughts.  They deny SI, HI, and AVH.  They rated depression at 6 out of 10.  They report they feel less irritable today.  They report they have been able to participate in group in the unit milieu.  They were excepted at [facility] for Monday and are agreeable with the plan to discharge there for follow-up.  Had also discussed ACT services and will place that referral as well.  They voiced no concerns or complaints at this time.  8/13: Patient seen for follow up they are alert and oriented. They report improved sleep. They not improved anxiety. Discussed that they have a history of HTN but are not interested in med management and prefer lifestyle change. Feels depression is improving rates at 7/10. Denies SI/HI/AVH. Is less irritable today, states he feels safe here and that has helped him calm down. Paranoia ad persecutory thoughts continue. Clarifies that he moved here from [place] 6 years ago and has never really settled in feels that people down here want to see him struggle and feel that he is targeted by others and that people do not see it.   Sleep: Good  Appetite:   Good  Past Psychiatric History: see h&P Family History: History reviewed. No pertinent family history. Social History:  Social History   Substance and Sexual Activity  Alcohol Use Not Currently     Social History   Substance and Sexual Activity  Drug Use Yes   Types: Marijuana, Cocaine    Social History   Socioeconomic History   Marital status: Married    Spouse name: Not on file   Number of children: Not on file   Years of education: Not on file   Highest education level: Not on file  Occupational History   Not on file  Tobacco Use   Smoking status: Every Day   Smokeless tobacco: Never  Vaping Use   Vaping status: Never Used  Substance and Sexual Activity   Alcohol use: Not Currently   Drug use: Yes    Types: Marijuana, Cocaine   Sexual activity: Not on file  Other Topics Concern   Not on file  Social History Narrative   Not on file   Social Drivers of Health   Financial Resource Strain: High Risk (01/03/2024)   Received from [facility]   Overall Financial Resource Strain (CARDIA)    Difficulty of Paying Living Expenses: Very hard  Food Insecurity: Patient Declined (07/09/2024)   Hunger Vital Sign    Worried About Running Out of Food in the Last Year: Patient declined    Barista  in the Last Year: Patient declined  Transportation Needs: Unmet Transportation Needs (07/09/2024)   PRAPARE - Administrator, Civil Service (Medical): Yes    Lack of Transportation (Non-Medical): Yes  Physical Activity: Not on file  Stress: Not on file  Social Connections: Not on file   Past Medical History:  Past Medical History:  Diagnosis Date   Anxiety    Depression    PTSD (post-traumatic stress disorder)    Schizophrenia (HCC)    History reviewed. No pertinent surgical history.  Current Medications: Current Facility-Administered Medications  Medication Dose Route Frequency Provider Last Rate Last Admin   acetaminophen  (TYLENOL ) tablet 650 mg   650 mg Oral Q6H PRN Jadapalle, Sree, MD       alum & mag hydroxide-simeth (MAALOX/MYLANTA) 200-200-20 MG/5ML suspension 30 mL  30 mL Oral Q4H PRN Jadapalle, Sree, MD       busPIRone  (BUSPAR ) tablet 10 mg  10 mg Oral BID Jadapalle, Sree, MD   10 mg at 07/13/24 9146   haloperidol  (HALDOL ) tablet 5 mg  5 mg Oral TID PRN Jadapalle, Sree, MD       And   diphenhydrAMINE  (BENADRYL ) capsule 50 mg  50 mg Oral TID PRN Jadapalle, Sree, MD       haloperidol  lactate (HALDOL ) injection 5 mg  5 mg Intramuscular TID PRN Jadapalle, Sree, MD       And   diphenhydrAMINE  (BENADRYL ) injection 50 mg  50 mg Intramuscular TID PRN Jadapalle, Sree, MD       And   LORazepam  (ATIVAN ) injection 2 mg  2 mg Intramuscular TID PRN Jadapalle, Sree, MD       haloperidol  lactate (HALDOL ) injection 10 mg  10 mg Intramuscular TID PRN Jadapalle, Sree, MD       And   diphenhydrAMINE  (BENADRYL ) injection 50 mg  50 mg Intramuscular TID PRN Jadapalle, Sree, MD       And   LORazepam  (ATIVAN ) injection 2 mg  2 mg Intramuscular TID PRN Jadapalle, Sree, MD       escitalopram  (LEXAPRO ) tablet 10 mg  10 mg Oral Daily Jadapalle, Sree, MD   10 mg at 07/13/24 9146   hydrOXYzine  (ATARAX ) tablet 25 mg  25 mg Oral Q6H PRN Jadapalle, Sree, MD       magnesium  hydroxide (MILK OF MAGNESIA) suspension 30 mL  30 mL Oral Daily PRN Jadapalle, Sree, MD       OLANZapine  (ZYPREXA ) tablet 10 mg  10 mg Oral QHS Jadapalle, Sree, MD   10 mg at 07/12/24 2111   traZODone  (DESYREL ) tablet 50 mg  50 mg Oral QHS PRN Jadapalle, Sree, MD   50 mg at 07/09/24 2119    Lab Results: No results found for this or any previous visit (from the past 48 hours).  Blood Alcohol level:  Lab Results  Component Value Date   [facility] <15 07/08/2024   ETH <10 03/14/2024    Metabolic Disorder Labs: No results found for: HGBA1C, MPG No results found for: PROLACTIN No results found for: CHOL, TRIG, HDL, CHOLHDL, VLDL, LDLCALC  Physical Findings: AIMS:  , ,  ,  ,     CIWA:    COWS:      Psychiatric Specialty Exam:  Presentation  General Appearance:  Casual  Eye Contact: Fair  Speech: Clear and Coherent  Speech Volume: Increased    Mood and Affect  Mood: Dysphoric; Labile; Irritable  Affect: Blunt   Thought Process  Thought Processes: Disorganized  Descriptions of Associations:Tangential  Orientation:Full (Time, Place and Person)  Thought Content:Paranoid Ideation  Hallucinations:No data recorded  Ideas of Reference:Percusatory; Paranoia  Suicidal Thoughts:No data recorded  Homicidal Thoughts:No data recorded   Sensorium  Memory: Immediate Fair  Judgment: Impaired  Insight: Lacking   Executive Functions  Concentration: Poor  Attention Span: Fair  Recall: Poor  Fund of Knowledge: Fair  Language: Fair   Psychomotor Activity  Psychomotor Activity: No data recorded  Musculoskeletal: Strength & Muscle Tone: within normal limits Gait & Station: normal Assets  Assets: Manufacturing systems engineer; Desire for Improvement    Physical Exam: Physical Exam Vitals and nursing note reviewed.  HENT:     Head: Atraumatic.  Eyes:     Extraocular Movements: Extraocular movements intact.  Pulmonary:     Effort: Pulmonary effort is normal.  Neurological:     Mental Status: He is alert and oriented to person, place, and time.    Review of Systems  Psychiatric/Behavioral:  Negative for depression, hallucinations, substance abuse and suicidal ideas. The patient is nervous/anxious. The patient does not have insomnia.    Blood pressure (!) 110/90, pulse 73, temperature 97.9 F (36.6 C), resp. rate 20, height 5' 11 (1.803 m), weight 95.3 kg, SpO2 99%. Body mass index is 29.29 kg/m.  Diagnosis: Principal Problem:   MDD (major depressive disorder), recurrent severe, without psychosis (HCC)   PLAN: Safety and Monitoring:  -- Voluntary admission to inpatient psychiatric unit for safety,  stabilization and treatment  -- Daily contact with patient to assess and evaluate symptoms and progress in treatment  -- Patient's case to be discussed in multi-disciplinary team meeting  -- Observation Level : q15 minute checks  -- Vital signs:  q12 hours  -- Precautions: suicide, elopement, and assault -- Encouraged patient to participate in unit milieu and in scheduled group therapies  2. Psychiatric Diagnoses and Treatment:   Schizoaffective disorder Anxiety  Depression  Polysubstance use  PTSD Patient is improving.  He has been accepted to treatment.  He is medication compliant.  No PRNs overnight.  No med adjustments today.  Continue BusPar  10 mg BID Lexapro  10 mg daily Discontinue Tenex  1 mg daily Zyprexa  10 mg nightly -- The risks/benefits/side-effects/alternatives to this medication were discussed in detail with the patient and time was given for questions. The patient consents to medication trial.                -- Metabolic profile and EKG monitoring obtained while on an atypical antipsychotic (BMI: Lipid Panel: HbgA1c: QTc:)              -- Encouraged patient to participate in unit milieu and in scheduled group therapies                               3. Medical Issues Being Addressed:  Discussed HTN and he indicates he prefers lifestyle change over med management.    4. Discharge Planning:   -- Social work and case management to assist with discharge planning and identification of hospital follow-up needs prior to discharge  -- Estimated LOS: 3-4 days  Donnice FORBES Right, PA-C 07/13/2024, 3:52 PM

## 2024-07-13 NOTE — Group Note (Signed)
 Date:  07/13/2024 Time:  11:25 PM  Group Topic/Focus:  Goals Group:   The focus of this group is to help patients establish daily goals to achieve during treatment and discuss how the patient can incorporate goal setting into their daily lives to aide in recovery. Personal Choices and Values:   The focus of this group is to help patients assess and explore the importance of values in their lives, how their values affect their decisions, how they express their values and what opposes their expression. Self Esteem Action Plan:   The focus of this group is to help patients create a plan to continue to build self-esteem after discharge.    Participation Level:  Active  Participation Quality:  Appropriate and Attentive  Affect:  Appropriate  Cognitive:  Alert, Appropriate, and Oriented  Insight: Appropriate and Good  Engagement in Group:  Engaged  Modes of Intervention:  Discussion and Support  Additional Comments:  N/A  Justin Neal 07/13/2024, 11:25 PM

## 2024-07-13 NOTE — Group Note (Signed)
 Date:  07/13/2024 Time:  10:38 AM  Group Topic/Focus:  Goals Group:   The focus of this group is to help patients establish daily goals to achieve during treatment and discuss how the patient can incorporate goal setting into their daily lives to aide in recovery.    Participation Level:  Did Not Attend   Justin Neal [facility] 07/13/2024, 10:38 AM

## 2024-07-13 NOTE — Plan of Care (Signed)

## 2024-07-14 DIAGNOSIS — F332 Major depressive disorder, recurrent severe without psychotic features: Secondary | ICD-10-CM | POA: Diagnosis not present

## 2024-07-14 NOTE — Progress Notes (Signed)
 [facility] MD Progress Note  07/14/2024 3:51 PM Justin Neal  MRN:  [id number]   Subjective:  Chart reviewed, case discussed in multidisciplinary meeting, patient seen during rounds.   8/16: On follow-up patient is alert and oriented.  They are pleasant and cooperative on exam.  They deny adverse effects of medications.  They deny SI, HI, and AVH.  They voiced no concerns or complaints at this time.  They demonstrate good insight into the need for outpatient follow-up and medication compliance. [facility]    8/15: Patient seen for follow-up today.  They are alert and oriented.  They are pleasant and cooperative on exam.  They have been accepted at [facility] for Monday.  We have also referred for ACT services.  Discussed that we will discontinue guanfacine  today given the off label use and patient's desire to minimize medication list.  Patient notes stable mood appetite and sleep.  He voices no concerns or complaints at this time.  8/14: Patient seen for follow-up.  They are alert and oriented.  They are pleasant and cooperative.  They report improved sleep, appetite, and mood.  They are linear and logical on exam.  Continues to continue to have some persecutory thoughts.  They deny SI, HI, and AVH.  They rated depression at 6 out of 10.  They report they feel less irritable today.  They report they have been able to participate in group in the unit milieu.  They were excepted at [facility] for Monday and are agreeable with the plan to discharge there for follow-up.  Had also discussed ACT services and will place that referral as well.  They voiced no concerns or complaints at this time.  8/13: Patient seen for follow up they are alert and oriented. They report improved sleep. They not improved anxiety. Discussed that they have a history of HTN but are not interested in med management and prefer lifestyle change. Feels depression is improving rates at 7/10. Denies SI/HI/AVH. Is less  irritable today, states he feels safe here and that has helped him calm down. Paranoia ad persecutory thoughts continue. Clarifies that he moved here from [place] 6 years ago and has never really settled in feels that people down here want to see him struggle and feel that he is targeted by others and that people do not see it.   Sleep: Good  Appetite:  Good  Past Psychiatric History: see h&P Family History: History reviewed. No pertinent family history. Social History:  Social History   Substance and Sexual Activity  Alcohol Use Not Currently     Social History   Substance and Sexual Activity  Drug Use Yes   Types: Marijuana, Cocaine    Social History   Socioeconomic History   Marital status: Married    Spouse name: Not on file   Number of children: Not on file   Years of education: Not on file   Highest education level: Not on file  Occupational History   Not on file  Tobacco Use   Smoking status: Every Day   Smokeless tobacco: Never  Vaping Use   Vaping status: Never Used  Substance and Sexual Activity   Alcohol use: Not Currently   Drug use: Yes    Types: Marijuana, Cocaine   Sexual activity: Not on file  Other Topics Concern   Not on file  Social History Narrative   Not on file   Social Drivers of Health  Financial Resource Strain: High Risk (01/03/2024)   Received from [facility]   Overall Financial Resource Strain (CARDIA)    Difficulty of Paying Living Expenses: Very hard  Food Insecurity: Patient Declined (07/09/2024)   Hunger Vital Sign    Worried About Running Out of Food in the Last Year: Patient declined    Ran Out of Food in the Last Year: Patient declined  Transportation Needs: Unmet Transportation Needs (07/09/2024)   PRAPARE - Administrator, Civil Service (Medical): Yes    Lack of Transportation (Non-Medical): Yes  Physical Activity: Not on file  Stress: Not on file  Social Connections: Not on file   Past Medical History:  Past  Medical History:  Diagnosis Date   Anxiety    Depression    PTSD (post-traumatic stress disorder)    Schizophrenia (HCC)    History reviewed. No pertinent surgical history.  Current Medications: Current Facility-Administered Medications  Medication Dose Route Frequency Provider Last Rate Last Admin   acetaminophen  (TYLENOL ) tablet 650 mg  650 mg Oral Q6H PRN Jadapalle, Sree, MD   650 mg at 07/14/24 1521   alum & mag hydroxide-simeth (MAALOX/MYLANTA) 200-200-20 MG/5ML suspension 30 mL  30 mL Oral Q4H PRN Jadapalle, Sree, MD       busPIRone  (BUSPAR ) tablet 10 mg  10 mg Oral BID Jadapalle, Sree, MD   10 mg at 07/14/24 9057   haloperidol  (HALDOL ) tablet 5 mg  5 mg Oral TID PRN Jadapalle, Sree, MD       And   diphenhydrAMINE  (BENADRYL ) capsule 50 mg  50 mg Oral TID PRN Jadapalle, Sree, MD       haloperidol  lactate (HALDOL ) injection 5 mg  5 mg Intramuscular TID PRN Jadapalle, Sree, MD       And   diphenhydrAMINE  (BENADRYL ) injection 50 mg  50 mg Intramuscular TID PRN Jadapalle, Sree, MD       And   LORazepam  (ATIVAN ) injection 2 mg  2 mg Intramuscular TID PRN Jadapalle, Sree, MD       haloperidol  lactate (HALDOL ) injection 10 mg  10 mg Intramuscular TID PRN Donnelly Mellow, MD       And   diphenhydrAMINE  (BENADRYL ) injection 50 mg  50 mg Intramuscular TID PRN Jadapalle, Sree, MD       And   LORazepam  (ATIVAN ) injection 2 mg  2 mg Intramuscular TID PRN Jadapalle, Sree, MD       escitalopram  (LEXAPRO ) tablet 10 mg  10 mg Oral Daily Jadapalle, Sree, MD   10 mg at 07/14/24 9057   hydrOXYzine  (ATARAX ) tablet 25 mg  25 mg Oral Q6H PRN Jadapalle, Sree, MD       magnesium  hydroxide (MILK OF MAGNESIA) suspension 30 mL  30 mL Oral Daily PRN Donnelly Mellow, MD       nicotine  (NICODERM CQ  - dosed in mg/24 hours) patch 21 mg  21 mg Transdermal Daily Jadapalle, Sree, MD   21 mg at 07/14/24 9057   OLANZapine  (ZYPREXA ) tablet 10 mg  10 mg Oral QHS Jadapalle, Sree, MD   10 mg at 07/13/24 2113   traZODone   (DESYREL ) tablet 50 mg  50 mg Oral QHS PRN Jadapalle, Sree, MD   50 mg at 07/09/24 2119    Lab Results: No results found for this or any previous visit (from the past 48 hours).  Blood Alcohol level:  Lab Results  Component Value Date   [facility] <15 07/08/2024   ETH <10 03/14/2024  Metabolic Disorder Labs: No results found for: HGBA1C, MPG No results found for: PROLACTIN No results found for: CHOL, TRIG, HDL, CHOLHDL, VLDL, LDLCALC  Physical Findings: AIMS:  , ,  ,  ,    CIWA:    COWS:      Psychiatric Specialty Exam:  Presentation  General Appearance:  Casual  Eye Contact: Fair  Speech: Clear and Coherent  Speech Volume: Increased    Mood and Affect  Mood: Dysphoric; Labile; Irritable  Affect: Blunt   Thought Process  Thought Processes: Disorganized  Descriptions of Associations:Tangential  Orientation:Full (Time, Place and Person)  Thought Content:Paranoid Ideation  Hallucinations:No data recorded  Ideas of Reference:Percusatory; Paranoia  Suicidal Thoughts:No data recorded  Homicidal Thoughts:No data recorded   Sensorium  Memory: Immediate Fair  Judgment: Impaired  Insight: Lacking   Executive Functions  Concentration: Poor  Attention Span: Fair  Recall: Poor  Fund of Knowledge: Fair  Language: Fair   Psychomotor Activity  Psychomotor Activity: No data recorded  Musculoskeletal: Strength & Muscle Tone: within normal limits Gait & Station: normal Assets  Assets: Manufacturing systems engineer; Desire for Improvement    Physical Exam: Physical Exam Vitals and nursing note reviewed.  HENT:     Head: Atraumatic.  Eyes:     Extraocular Movements: Extraocular movements intact.  Pulmonary:     Effort: Pulmonary effort is normal.  Neurological:     Mental Status: He is alert and oriented to person, place, and time.    Review of Systems  Psychiatric/Behavioral:  Negative for depression,  hallucinations, substance abuse and suicidal ideas. The patient is nervous/anxious. The patient does not have insomnia.    Blood pressure (!) 125/93, pulse 75, temperature 98.3 F (36.8 C), resp. rate 17, height 5' 11 (1.803 m), weight 95.3 kg, SpO2 100%. Body mass index is 29.29 kg/m.  Diagnosis: Principal Problem:   MDD (major depressive disorder), recurrent severe, without psychosis (HCC)   PLAN: Safety and Monitoring:  -- Voluntary admission to inpatient psychiatric unit for safety, stabilization and treatment  -- Daily contact with patient to assess and evaluate symptoms and progress in treatment  -- Patient's case to be discussed in multi-disciplinary team meeting  -- Observation Level : q15 minute checks  -- Vital signs:  q12 hours  -- Precautions: suicide, elopement, and assault -- Encouraged patient to participate in unit milieu and in scheduled group therapies  2. Psychiatric Diagnoses and Treatment:   Schizoaffective disorder Anxiety  Depression  Polysubstance use  PTSD Patient is improving.  He has been accepted to treatment.  He is medication compliant.  No PRNs overnight.  No med adjustments today.  Continue BusPar  10 mg BID Lexapro  10 mg daily Discontinue Tenex  1 mg daily Zyprexa  10 mg nightly -- The risks/benefits/side-effects/alternatives to this medication were discussed in detail with the patient and time was given for questions. The patient consents to medication trial.                -- Metabolic profile and EKG monitoring obtained while on an atypical antipsychotic (BMI: Lipid Panel: HbgA1c: QTc:)              -- Encouraged patient to participate in unit milieu and in scheduled group therapies                               3. Medical Issues Being Addressed:  Discussed HTN and he indicates he prefers lifestyle change over med  management.    4. Discharge Planning:   -- Social work and case management to assist with discharge planning and identification  of hospital follow-up needs prior to discharge  -- Estimated LOS: 3-4 days  Donnice FORBES Right, PA-C 07/14/2024, 3:51 PM

## 2024-07-14 NOTE — Progress Notes (Signed)
 Patient denies SI, HI and AVH this shift. Patient has had no incidents of behavioral dyscontrol. Patient has been compliant with medications, attended groups and engaged in 1:1 therapeutic staff talks.  Assess patient for safety, offer medications as planned, engage patient in 1:1 staff talks.  Continue to monitor as planned. Patient able to contract for safety.

## 2024-07-14 NOTE — Plan of Care (Signed)

## 2024-07-14 NOTE — Progress Notes (Addendum)
 Patient was observed interacting appropriately with peers in the dayroom during initial rounds. During 1:1 assessment, patient was alert, oriented, calm, and pleasant. He described his mood as okay, which was congruent with his calm affect. He denied any pain, suicidal or homicidal ideation, or auditory/visual hallucinations. Patient was compliant with medications and actively participated in the evening wrap-up groups. No unsafe behaviors observed or concerns reported at this time.   07/14/24 2213  Psych Admission Type (Psych Patients Only)  Admission Status Voluntary  Psychosocial Assessment  Patient Complaints None  Eye Contact Fair  Facial Expression Sad  Affect Sad  Speech Other (Comment)  Interaction Assertive  Motor Activity Other (Comment)  Appearance/Hygiene In scrubs  Behavior Characteristics Cooperative;Appropriate to situation  Mood Other (Comment) (Pt stated mood as Ok')  Teacher, early years/pre WDL  Content WDL  Delusions WDL  Perception WDL  Hallucination None reported or observed  Judgment WDL  Confusion WDL  Danger to Self  Current suicidal ideation? Denies  Self-Injurious Behavior No self-injurious ideation or behavior indicators observed or expressed   Agreement Not to Harm Self Yes  Description of Agreement Verbal  Danger to Others  Danger to Others None reported or observed  Danger to Others Abnormal  Harmful Behavior to others No threats or harm toward other people  Description of Harmful Behavior None  Destructive Behavior No threats or harm toward property

## 2024-07-14 NOTE — Group Note (Signed)
 Date:  07/14/2024 Time:  9:00 PM  Group Topic/Focus:  Managing Feelings:   The focus of this group is to identify what feelings patients have difficulty handling and develop a plan to handle them in a healthier way upon discharge.    Participation Level:  Active  Participation Quality:  Appropriate and Attentive  Affect:  Appropriate  Cognitive:  Alert and Appropriate  Insight: Appropriate, Good, and Improving  Engagement in Group:  Developing/Improving and Engaged  Modes of Intervention:  Clarification, Discussion, Education, Rapport Building, and Support  Additional Comments:     Venessa Wickham 07/14/2024, 9:00 PM

## 2024-07-14 NOTE — Plan of Care (Signed)
   Problem: Education: Goal: Mental status will improve Outcome: Progressing   Problem: Activity: Goal: Interest or engagement in activities will improve Outcome: Progressing   Problem: Coping: Goal: Ability to demonstrate self-control will improve Outcome: Progressing

## 2024-07-14 NOTE — Progress Notes (Signed)
   07/13/24 2200  Psych Admission Type (Psych Patients Only)  Admission Status Voluntary  Psychosocial Assessment  Patient Complaints None  Eye Contact Fair  Facial Expression Animated  Affect Appropriate to circumstance  Speech Logical/coherent  Interaction Assertive  Motor Activity Slow  Appearance/Hygiene Unremarkable;In scrubs  Behavior Characteristics Cooperative  Mood Pleasant  Aggressive Behavior  Effect No apparent injury  Thought Process  Coherency WDL  Content WDL  Delusions None reported or observed  Perception WDL  Hallucination None reported or observed  Judgment WDL  Confusion None  Danger to Self  Current suicidal ideation? Denies  Danger to Others  Danger to Others None reported or observed  Danger to Others Abnormal  Harmful Behavior to others No threats or harm toward other people  Destructive Behavior No threats or harm toward property   Pt alert and able to make needs known.  Participated in the milieu, denies SI/HI/AVH.  Received all medications as scheduled to include request for nicotine  patch.  C/o pain of 5/10 to Right side of back however stated pain was manageable and refused pain interventions.  No further complaints voiced, sleeping in bed with eyes closed at this time respirations even and unlabored.

## 2024-07-14 NOTE — Group Note (Signed)
 LCSW Group Therapy Note  Group Date: 07/14/2024 Start Time: 1300 End Time: 1400   Type of Therapy and Topic:  Group Therapy - Healthy vs Unhealthy Coping Skills  Participation Level:  Did Not Attend   Description of Group The focus of this group was to determine what unhealthy coping techniques typically are used by group members and what healthy coping techniques would be helpful in coping with various problems. Patients were guided in becoming aware of the differences between healthy and unhealthy coping techniques. Patients were asked to identify 2-3 healthy coping skills they would like to learn to use more effectively.  Therapeutic Goals Patients learned that coping is what human beings do all day long to deal with various situations in their lives Patients defined and discussed healthy vs unhealthy coping techniques Patients identified their preferred coping techniques and identified whether these were healthy or unhealthy Patients determined 2-3 healthy coping skills they would like to become more familiar with and use more often. Patients provided support and ideas to each other   Summary of Patient Progress:  Patient did not attend.    Therapeutic Modalities Cognitive Behavioral Therapy Motivational Interviewing  Alveta CHRISTELLA Ezzard ISRAEL 07/14/2024  2:57 PM

## 2024-07-15 DIAGNOSIS — F332 Major depressive disorder, recurrent severe without psychotic features: Secondary | ICD-10-CM | POA: Diagnosis not present

## 2024-07-15 NOTE — Group Note (Signed)
 Date:  07/15/2024 Time:  10:32 AM  Group Topic/Focus:  Goals Group:   The focus of this group is to help patients establish daily goals to achieve during treatment and discuss how the patient can incorporate goal setting into their daily lives to aide in recovery.    Participation Level:  Did Not Attend   Justin Neal 07/15/2024, 10:32 AM

## 2024-07-15 NOTE — B[facility] Suicide Risk Assessment (Signed)
 [facility] Discharge Suicide Risk Assessment   Principal Problem: MDD (major depressive disorder), recurrent severe, without psychosis (HCC) Discharge Diagnoses: Principal Problem:   MDD (major depressive disorder), recurrent severe, without psychosis (HCC)   Total Time spent with patient: 1 hour  Musculoskeletal: Strength & Muscle Tone: within normal limits Gait & Station: normal Patient leans: N/A  Psychiatric Specialty Exam  Presentation  General Appearance:  Casual  Eye Contact: Fair  Speech: Clear and Coherent  Speech Volume: Normal  Handedness:No data recorded  Mood and Affect  Mood: Euthymic  Duration of Depression Symptoms: No data recorded Affect: Congruent   Thought Process  Thought Processes: Coherent  Descriptions of Associations:Intact  Orientation:Full (Time, Place and Person)  Thought Content:WDL  History of Schizophrenia/Schizoaffective disorder:Yes  Duration of Psychotic Symptoms:Greater than six months  Hallucinations:Hallucinations: None  Ideas of Reference:None  Suicidal Thoughts:Suicidal Thoughts: No  Homicidal Thoughts:Homicidal Thoughts: No   Sensorium  Memory: Immediate Fair; Recent Fair  Judgment: Intact  Insight: Good   Executive Functions  Concentration: Good  Attention Span: Good  Recall: Good  Fund of Knowledge: Good  Language: Good   Psychomotor Activity  Psychomotor Activity: Psychomotor Activity: Normal   Assets  Assets: Communication Skills; Desire for Improvement   Sleep  Sleep: Sleep: Good  Estimated Sleeping Duration (Last 24 Hours): 7.50-9.50 hours  Physical Exam: Physical Exam Vitals and nursing note reviewed.  HENT:     Head: Atraumatic.  Eyes:     Extraocular Movements: Extraocular movements intact.  Pulmonary:     Effort: Pulmonary effort is normal.  Neurological:     Mental Status: He is alert and oriented to person, place, and time.    Review of Systems   Psychiatric/Behavioral:  Negative for hallucinations, memory loss, substance abuse and suicidal ideas. The patient is nervous/anxious. The patient does not have insomnia.    Blood pressure (!) 118/100, pulse 88, temperature (!) 97.1 F (36.2 C), resp. rate 20, height 5' 11 (1.803 m), weight 95.3 kg, SpO2 100%. Body mass index is 29.29 kg/m.  Mental Status Per Nursing Assessment::   On Admission:  Thoughts of violence towards others  Demographic Factors:  Male, Low socioeconomic status, and Unemployed  Loss Factors: NA  Historical Factors: Impulsivity  Risk Reduction Factors:   Positive social support and Positive coping skills or problem solving skills  Continued Clinical Symptoms:  Previous Psychiatric Diagnoses and Treatments  Cognitive Features That Contribute To Risk:  None    Suicide Risk:  Minimal: No identifiable suicidal ideation.  Patients presenting with no risk factors but with morbid ruminations; may be classified as minimal risk based on the severity of the depressive symptoms   Follow-up Information     [facility] Follow up.   Specialty: Addiction Medicine Contact information: [address] [place] [address] [phone]         [facility] Follow up.   Why: You have been accepted to [facility] on Monday 07/16/2024 at 10AM. Contact information: [address], [place] [address]  Physical Address    [address] [place], [place] [address]  Phone:  [phone] Fax:      [phone]                Plan Of Care/Follow-up recommendations:  # It is recommended to the patient to continue psychiatric medications as prescribed, after discharge from the hospital.   # It is recommended to the patient to follow up with your  outpatient psychiatric provider and PCP. # It was discussed with the patient, the impact of alcohol, drugs, tobacco have been there overall psychiatric and medical  wellbeing, and total abstinence from substance use was recommended. # Prescriptions provided or sent directly to preferred pharmacy at discharge. Patient agreeable to plan. Given the opportunity to ask questions. Appears to feel comfortable with discharge.  # In the event of worsening symptoms, the patient is instructed to call the crisis hotline (988), 911 and or go to the nearest ED for appropriate evaluation and treatment of symptoms. To follow-up with primary care provider for other medical issues, concerns and or health care needs # Patient was discharged  as requested with a plan to follow up as noted above.    Donnice FORBES Right, PA-C 07/15/2024, 4:49 PM

## 2024-07-15 NOTE — Progress Notes (Signed)
   07/15/24 0900  Psych Admission Type (Psych Patients Only)  Admission Status Voluntary  Psychosocial Assessment  Patient Complaints None ( Everything good this morning.)  Eye Contact Fair  Facial Expression Other (Comment) (WNL)  Affect Appropriate to circumstance  Speech Unremarkable  Interaction Assertive  Motor Activity Other (Comment) (WNL)  Appearance/Hygiene Unremarkable  Behavior Characteristics Cooperative;Appropriate to situation  Mood Pleasant  Thought Process  Coherency WDL  Content WDL  Delusions None reported or observed  Perception WDL  Hallucination None reported or observed  Judgment Impaired  Confusion None  Danger to Self  Current suicidal ideation? Denies  Self-Injurious Behavior No self-injurious ideation or behavior indicators observed or expressed   Agreement Not to Harm Self Yes  Description of Agreement verbal  Danger to Others  Danger to Others None reported or observed  Danger to Others Abnormal  Harmful Behavior to others No threats or harm toward other people

## 2024-07-15 NOTE — Group Note (Signed)
 Date:  07/15/2024 Time:  9:04 PM  Group Topic/Focus:  Wrap-Up Group:   The focus of this group is to help patients review their daily goal of treatment and discuss progress on daily workbooks.    Participation Level:  Active  Participation Quality:  Appropriate and Attentive  Affect:  Appropriate  Cognitive:  Appropriate  Insight: Appropriate  Engagement in Group:  Engaged  Modes of Intervention:  Support  Additional Comments:     Kerri Katz 07/15/2024, 9:04 PM

## 2024-07-15 NOTE — Plan of Care (Signed)
   Problem: Safety: Goal: Periods of time without injury will increase Outcome: Progressing

## 2024-07-15 NOTE — Progress Notes (Signed)
   07/15/24 2130  Psych Admission Type (Psych Patients Only)  Admission Status Voluntary  Psychosocial Assessment  Patient Complaints None  Eye Contact Fair  Facial Expression Animated  Affect Appropriate to circumstance  Speech Logical/coherent  Interaction Assertive  Motor Activity Other (Comment) (WDL)  Appearance/Hygiene Unremarkable  Behavior Characteristics Cooperative  Mood Pleasant  Thought Process  Coherency WDL  Content WDL  Delusions None reported or observed  Perception WDL  Hallucination None reported or observed  Judgment Impaired  Confusion None  Danger to Self  Current suicidal ideation? Denies  Agreement Not to Harm Self Yes  Description of Agreement verbal  Danger to Others  Danger to Others None reported or observed  Danger to Others Abnormal  Harmful Behavior to others No threats or harm toward other people

## 2024-07-15 NOTE — Progress Notes (Signed)
 [facility] MD Progress Note  07/15/2024 1:08 PM Justin Neal  MRN:  [id number]   Subjective:  Chart reviewed, case discussed in multidisciplinary meeting, patient seen during rounds.   8/17: On follow-up patient is alert and oriented.  They are pleasant and cooperative on exam.  They deny adverse effects of medications.  They deny SI, HI, and AVH.  They voiced no concerns or complaints at this time.  They demonstrate good judgement and insight into the need for outpatient follow-up and medication compliance. They are excited about starting at [facility] and they are appreciative to the staff for helping them. Depression 3/10. Anxiety 3/10.   8/16: On follow-up patient is alert and oriented.  They are pleasant and cooperative on exam.  They deny adverse effects of medications.  They deny SI, HI, and AVH.  They voiced no concerns or complaints at this time.  They demonstrate good insight into the need for outpatient follow-up and medication compliance. [facility]    8/15: Patient seen for follow-up today.  They are alert and oriented.  They are pleasant and cooperative on exam.  They have been accepted at [facility] for Monday.  We have also referred for ACT services.  Discussed that we will discontinue guanfacine  today given the off label use and patient's desire to minimize medication list.  Patient notes stable mood appetite and sleep.  He voices no concerns or complaints at this time.  8/14: Patient seen for follow-up.  They are alert and oriented.  They are pleasant and cooperative.  They report improved sleep, appetite, and mood.  They are linear and logical on exam.  Continues to continue to have some persecutory thoughts.  They deny SI, HI, and AVH.  They rated depression at 6 out of 10.  They report they feel less irritable today.  They report they have been able to participate in group in the unit milieu.  They were excepted at [facility] for Monday and are agreeable with the  plan to discharge there for follow-up.  Had also discussed ACT services and will place that referral as well.  They voiced no concerns or complaints at this time.  8/13: Patient seen for follow up they are alert and oriented. They report improved sleep. They not improved anxiety. Discussed that they have a history of HTN but are not interested in med management and prefer lifestyle change. Feels depression is improving rates at 7/10. Denies SI/HI/AVH. Is less irritable today, states he feels safe here and that has helped him calm down. Paranoia ad persecutory thoughts continue. Clarifies that he moved here from [place] 6 years ago and has never really settled in feels that people down here want to see him struggle and feel that he is targeted by others and that people do not see it.   Sleep: Good  Appetite:  Good  Past Psychiatric History: see h&P Family History: History reviewed. No pertinent family history. Social History:  Social History   Substance and Sexual Activity  Alcohol Use Not Currently     Social History   Substance and Sexual Activity  Drug Use Yes   Types: Marijuana, Cocaine    Social History   Socioeconomic History   Marital status: Married    Spouse name: Not on file   Number of children: Not on file   Years of education: Not on file   Highest education level: Not on file  Occupational History  Not on file  Tobacco Use   Smoking status: Every Day   Smokeless tobacco: Never  Vaping Use   Vaping status: Never Used  Substance and Sexual Activity   Alcohol use: Not Currently   Drug use: Yes    Types: Marijuana, Cocaine   Sexual activity: Not on file  Other Topics Concern   Not on file  Social History Narrative   Not on file   Social Drivers of Health   Financial Resource Strain: High Risk (01/03/2024)   Received from [facility]   Overall Financial Resource Strain (CARDIA)    Difficulty of Paying Living Expenses: Very hard  Food Insecurity: Patient  Declined (07/09/2024)   Hunger Vital Sign    Worried About Running Out of Food in the Last Year: Patient declined    Ran Out of Food in the Last Year: Patient declined  Transportation Needs: Unmet Transportation Needs (07/09/2024)   PRAPARE - Administrator, Civil Service (Medical): Yes    Lack of Transportation (Non-Medical): Yes  Physical Activity: Not on file  Stress: Not on file  Social Connections: Not on file   Past Medical History:  Past Medical History:  Diagnosis Date   Anxiety    Depression    PTSD (post-traumatic stress disorder)    Schizophrenia (HCC)    History reviewed. No pertinent surgical history.  Current Medications: Current Facility-Administered Medications  Medication Dose Route Frequency Provider Last Rate Last Admin   acetaminophen  (TYLENOL ) tablet 650 mg  650 mg Oral Q6H PRN Jadapalle, Sree, MD   650 mg at 07/14/24 1521   alum & mag hydroxide-simeth (MAALOX/MYLANTA) 200-200-20 MG/5ML suspension 30 mL  30 mL Oral Q4H PRN Jadapalle, Sree, MD       busPIRone  (BUSPAR ) tablet 10 mg  10 mg Oral BID Jadapalle, Sree, MD   10 mg at 07/15/24 0919   haloperidol  (HALDOL ) tablet 5 mg  5 mg Oral TID PRN Jadapalle, Sree, MD       And   diphenhydrAMINE  (BENADRYL ) capsule 50 mg  50 mg Oral TID PRN Jadapalle, Sree, MD       haloperidol  lactate (HALDOL ) injection 5 mg  5 mg Intramuscular TID PRN Jadapalle, Sree, MD       And   diphenhydrAMINE  (BENADRYL ) injection 50 mg  50 mg Intramuscular TID PRN Jadapalle, Sree, MD       And   LORazepam  (ATIVAN ) injection 2 mg  2 mg Intramuscular TID PRN Jadapalle, Sree, MD       haloperidol  lactate (HALDOL ) injection 10 mg  10 mg Intramuscular TID PRN Donnelly Mellow, MD       And   diphenhydrAMINE  (BENADRYL ) injection 50 mg  50 mg Intramuscular TID PRN Jadapalle, Sree, MD       And   LORazepam  (ATIVAN ) injection 2 mg  2 mg Intramuscular TID PRN Jadapalle, Sree, MD       escitalopram  (LEXAPRO ) tablet 10 mg  10 mg Oral Daily  Jadapalle, Sree, MD   10 mg at 07/15/24 9080   hydrOXYzine  (ATARAX ) tablet 25 mg  25 mg Oral Q6H PRN Jadapalle, Sree, MD       magnesium  hydroxide (MILK OF MAGNESIA) suspension 30 mL  30 mL Oral Daily PRN Donnelly Mellow, MD       nicotine  (NICODERM CQ  - dosed in mg/24 hours) patch 21 mg  21 mg Transdermal Daily Jadapalle, Sree, MD   21 mg at 07/15/24 0919   OLANZapine  (ZYPREXA ) tablet 10 mg  10 mg Oral QHS Jadapalle, Sree, MD   10 mg at 07/14/24 2138   traZODone  (DESYREL ) tablet 50 mg  50 mg Oral QHS PRN Jadapalle, Sree, MD   50 mg at 07/09/24 2119    Lab Results: No results found for this or any previous visit (from the past 48 hours).  Blood Alcohol level:  Lab Results  Component Value Date   [facility] <15 07/08/2024   ETH <10 03/14/2024    Metabolic Disorder Labs: No results found for: HGBA1C, MPG No results found for: PROLACTIN No results found for: CHOL, TRIG, HDL, CHOLHDL, VLDL, LDLCALC  Physical Findings: AIMS:  , ,  ,  ,    CIWA:    COWS:      Psychiatric Specialty Exam:  Presentation  General Appearance:  Casual  Eye Contact: Fair  Speech: Clear and Coherent  Speech Volume: Normal    Mood and Affect  Mood: Euthymic  Affect: Congruent   Thought Process  Thought Processes: Coherent  Descriptions of Associations:Intact  Orientation:Full (Time, Place and Person)  Thought Content:WDL  Hallucinations:Hallucinations: None   Ideas of Reference:None  Suicidal Thoughts:Suicidal Thoughts: No   Homicidal Thoughts:Homicidal Thoughts: No    Sensorium  Memory: Immediate Fair; Recent Fair  Judgment: Intact  Insight: Good   Executive Functions  Concentration: Good  Attention Span: Good  Recall: Good  Fund of Knowledge: Good  Language: Good   Psychomotor Activity  Psychomotor Activity: Psychomotor Activity: Normal   Musculoskeletal: Strength & Muscle Tone: within normal limits Gait & Station:  normal Assets  Assets: Manufacturing systems engineer; Desire for Improvement    Physical Exam: Physical Exam Vitals and nursing note reviewed.  HENT:     Head: Atraumatic.  Eyes:     Extraocular Movements: Extraocular movements intact.  Pulmonary:     Effort: Pulmonary effort is normal.  Neurological:     Mental Status: He is alert and oriented to person, place, and time.  Psychiatric:        Mood and Affect: Mood normal.        Behavior: Behavior normal.        Thought Content: Thought content normal.        Judgment: Judgment normal.    Review of Systems  Psychiatric/Behavioral:  Negative for depression, hallucinations, substance abuse and suicidal ideas. The patient is nervous/anxious. The patient does not have insomnia.    Blood pressure 116/85, pulse 90, temperature (!) 97 F (36.1 C), resp. rate 18, height 5' 11 (1.803 m), weight 95.3 kg, SpO2 98%. Body mass index is 29.29 kg/m.  Diagnosis: Principal Problem:   MDD (major depressive disorder), recurrent severe, without psychosis (HCC)   PLAN: Safety and Monitoring:  -- Voluntary admission to inpatient psychiatric unit for safety, stabilization and treatment  -- Daily contact with patient to assess and evaluate symptoms and progress in treatment  -- Patient's case to be discussed in multi-disciplinary team meeting  -- Observation Level : q15 minute checks  -- Vital signs:  q12 hours  -- Precautions: suicide, elopement, and assault -- Encouraged patient to participate in unit milieu and in scheduled group therapies  2. Psychiatric Diagnoses and Treatment:   Schizoaffective disorder Anxiety  Depression  Polysubstance use  PTSD Patient is doing well, plan for [facility], pt is agreeable with this plan.  He is medication compliant.  No PRNs overnight.  No med adjustments today.  Continue BusPar  10 mg BID Lexapro  10 mg daily Discontinue Tenex  1 mg  daily Zyprexa  10 mg nightly -- The  risks/benefits/side-effects/alternatives to this medication were discussed in detail with the patient and time was given for questions. The patient consents to medication trial.                -- Metabolic profile and EKG monitoring obtained while on an atypical antipsychotic (BMI: Lipid Panel: HbgA1c: QTc:)              -- Encouraged patient to participate in unit milieu and in scheduled group therapies                3. Medical Issues Being Addressed:  Discussed HTN and he indicates he prefers lifestyle change over med management.    4. Discharge Planning:   -- Social work and case management to assist with discharge planning and identification of hospital follow-up needs prior to discharge  -- Discharge tomorrow.  Donnice FORBES Right, PA-C 07/15/2024, 1:08 PM

## 2024-07-15 NOTE — Plan of Care (Signed)
  Problem: Education: Goal: Emotional status will improve Outcome: Progressing Goal: Mental status will improve Outcome: Progressing   Problem: Activity: Goal: Interest or engagement in activities will improve Outcome: Progressing   Problem: Coping: Goal: Ability to verbalize frustrations and anger appropriately will improve Outcome: Progressing   Problem: Health Behavior/Discharge Planning: Goal: Identification of resources available to assist in meeting health care needs will improve Outcome: Progressing Goal: Compliance with treatment plan for underlying cause of condition will improve Outcome: Progressing

## 2024-07-16 ENCOUNTER — Encounter: Payer: Self-pay | Admitting: Psychiatry

## 2024-07-16 DIAGNOSIS — F332 Major depressive disorder, recurrent severe without psychotic features: Secondary | ICD-10-CM | POA: Diagnosis not present

## 2024-07-16 NOTE — Progress Notes (Signed)
 Patient denies SI/I/AVH at this time. Discharge instructions, AVS, prescriptions, and transition record reviewed with patient. Patient agrees to comply with medication management, follow-up visit and outpatient therapy. Patient belongings returned to patient. Patient questions and concerns addressed and answered.  Patient ambulatory off unit. Patient discharged via Taxi to [facility].

## 2024-07-16 NOTE — Plan of Care (Signed)

## 2024-07-16 NOTE — Progress Notes (Signed)
  [facility] Adult Case Management Discharge Plan :  Will you be returning to the same living situation after discharge:  No. Patient going to [facility] At discharge, do you have transportation home?: Yes,  CSW to assist with transportation needs.  Do you have the ability to pay for your medications: No.  Release of information consent forms completed and in the chart;  Patient's signature needed at discharge.  Patient to Follow up at:  Follow-up Information     [facility] Follow up.   Why: You have been accepted to [facility] on Monday 07/16/2024 at 10AM. Contact information: [address] [place], [place] [address]  Physical Address    [address] [place], [place] [address]  Phone:  [phone] Fax:      [phone]                Next level of care provider has access to [facility] Link:no  Safety Planning and Suicide Prevention discussed: Yes,  SPE completed with the patient as patient declined collateral contact.        Has patient been referred to the Quitline?: Patient refused referral for treatment  Patient has been referred for addiction treatment: Yes, the patient will follow up with an outpatient provider for substance use disorder. Psychiatrist/APP: appointment made  Sherryle JINNY Margo, LCSW 07/16/2024, 9:14 AM

## 2024-07-16 NOTE — Progress Notes (Signed)
   07/16/24 0800  Psych Admission Type (Psych Patients Only)  Admission Status Voluntary  Psychosocial Assessment  Patient Complaints Anxiety  Eye Contact Fair  Facial Expression Animated  Affect Appropriate to circumstance  Speech Logical/coherent  Interaction Assertive  Motor Activity Other (Comment) (appropriate for developmental age)  Appearance/Hygiene Unremarkable  Behavior Characteristics Cooperative  Mood Pleasant  Thought Process  Coherency WDL  Content WDL  Delusions None reported or observed  Perception WDL  Hallucination None reported or observed  Judgment WDL  Confusion None  Danger to Self  Current suicidal ideation? Denies  Self-Injurious Behavior No self-injurious ideation or behavior indicators observed or expressed   Danger to Others  Danger to Others None reported or observed  Danger to Others Abnormal  Harmful Behavior to others No threats or harm toward other people  Destructive Behavior No threats or harm toward property

## 2024-07-16 NOTE — Discharge Summary (Addendum)
 Physician Discharge Summary Note  Patient:  Justin Neal is an 48 y.o., male MRN:  [id number] DOB:  20-Jun-1976 Patient phone:  [phone] (home)  Patient address:   [address]  [place] [address],   Total time spent: 40 min Date of Admission:  07/09/2024 Date of Discharge: 07/16/24  Reason for Admission:  The patient is a 48 y/o male with a reported psychiatric history of schizophrenia, bipolar disorder, post-traumatic stress disorder, anxiety, and depression, presenting voluntarily for psychiatric admission following an ED evaluation for worsening mood instability, paranoia, irritability, and suicidal ideation. He reports being off all prescribed psychiatric medications for the past 1.5 months. He states he recently moved back to [place]  from [place] , where he had been working, but since returning has experienced increasing psychosocial stressors including loss of his car, reliance on others for transportation, housing instability, and living in boarding houses with individuals using illicit substances. He reports daily cannabis use and cocaine use a few times per week as self-medication. Patient is admitted to adult psych unit with Q15 min safety monitoring. Multidisciplinary team approach is offered. Medication management; group/milieu therapy is offered.   Principal Problem: Schizoaffective disorder, depression type Discharge Diagnoses: Schizoaffective disorder ,depression type  Past Psychiatric History: see h&p  Family Psychiatric  History: see h&p Social History:  Social History   Substance and Sexual Activity  Alcohol Use Not Currently     Social History   Substance and Sexual Activity  Drug Use Yes   Types: Marijuana, Cocaine    Social History   Socioeconomic History   Marital status: Married    Spouse name: Not on file   Number of children: Not on file   Years of education: Not on file   Highest education level: Not on file  Occupational History    Not on file  Tobacco Use   Smoking status: Every Day   Smokeless tobacco: Never  Vaping Use   Vaping status: Never Used  Substance and Sexual Activity   Alcohol use: Not Currently   Drug use: Yes    Types: Marijuana, Cocaine   Sexual activity: Not on file  Other Topics Concern   Not on file  Social History Narrative   Not on file   Social Drivers of Health   Financial Resource Strain: High Risk (01/03/2024)   Received from [facility]   Overall Financial Resource Strain (CARDIA)    Difficulty of Paying Living Expenses: Very hard  Food Insecurity: Patient Declined (07/09/2024)   Hunger Vital Sign    Worried About Running Out of Food in the Last Year: Patient declined    Ran Out of Food in the Last Year: Patient declined  Transportation Needs: Unmet Transportation Needs (07/09/2024)   PRAPARE - Administrator, Civil Service (Medical): Yes    Lack of Transportation (Non-Medical): Yes  Physical Activity: Not on file  Stress: Not on file  Social Connections: Not on file   Past Medical History:  Past Medical History:  Diagnosis Date   Anxiety    Depression    PTSD (post-traumatic stress disorder)    Schizophrenia (HCC)    History reviewed. No pertinent surgical history. Family History: History reviewed. No pertinent family history.  Hospital Course:  The patient is a 48 y/o male with a reported psychiatric history of schizophrenia, bipolar disorder, post-traumatic stress disorder, anxiety, and depression, presenting voluntarily for psychiatric admission following an ED evaluation for worsening mood instability, paranoia, irritability, and suicidal ideation. He  reports being off all prescribed psychiatric medications for the past 1.5 months. He states he recently moved back to [place]  from [place] , where he had been working, but since returning has experienced increasing psychosocial stressors including loss of his car, reliance on others for  transportation, housing instability, and living in boarding houses with individuals using illicit substances. He reports daily cannabis use and cocaine use a few times per week as self-medication. Patient is admitted to adult psych unit with Q15 min safety monitoring. Multidisciplinary team approach is offered. Medication management; group/milieu therapy is offered.  Detailed risk assessment is complete based on clinical exam and individual risk factors and acute suicide risk is low and acute violence risk is low.     On admission, patient was started on Lexapro  10 mg daily and Zyprexa  10 mg daily for mood stabilization.  As he reported ongoing anxiety Tenex  was discontinued, BuSpar  10 mg twice daily was initiated.  The patient tolerated the medications very well with no reported side effects.  He displayed safe behaviors on the unit and was engaging in the treatment plan.  On the day of discharge he consistently denied SI/HI/plan and denied hallucinations.  He remains future oriented and is willing to participate in outpatient mental health services.  He was [facility]. Currently, all modifiable risk of harm to self/harm to others have been addressed and patient is no longer appropriate for the acute inpatient setting and is able to continue treatment for mental health needs in the community with the supports as indicated below.  Patient is educated and verbalized understanding of discharge plan of care including medications, follow-up appointments, mental health resources and further crisis services in the community.  He is instructed to call 911 or present to the nearest emergency room should he experience any decompensation in mood, disturbance of bowel or return of suicidal/homicidal ideations.  Patient verbalizes understanding of this education and agrees to this plan of care  Physical Findings: AIMS:  , ,  ,  ,    CIWA:    COWS:        Psychiatric Specialty  Exam:  Presentation  General Appearance:  Casual  Eye Contact: Fair  Speech: Clear and Coherent  Speech Volume: Normal    Mood and Affect  Mood: Euthymic  Affect: Congruent   Thought Process  Thought Processes: Coherent  Descriptions of Associations:Intact  Orientation:Full (Time, Place and Person)  Thought Content:WDL  Hallucinations:Hallucinations: None  Ideas of Reference:None  Suicidal Thoughts:Suicidal Thoughts: No  Homicidal Thoughts:Homicidal Thoughts: No   Sensorium  Memory: Immediate Fair; Recent Fair  Judgment: Intact  Insight: Good   Executive Functions  Concentration: Good  Attention Span: Good  Recall: Good  Fund of Knowledge: Good  Language: Good   Psychomotor Activity  Psychomotor Activity: Psychomotor Activity: Normal  Musculoskeletal: Strength & Muscle Tone: within normal limits Gait & Station: normal Assets  Assets: Manufacturing systems engineer; Desire for Improvement   Sleep  Sleep: Sleep: Good    Physical Exam: Physical Exam ROS Blood pressure 113/81, pulse 80, temperature (!) 97 F (36.1 C), resp. rate 18, height 5' 11 (1.803 m), weight 95.3 kg, SpO2 98%. Body mass index is 29.29 kg/m.   Social History   Tobacco Use  Smoking Status Every Day  Smokeless Tobacco Never   Tobacco Cessation:  A prescription for an FDA-approved tobacco cessation medication was offered at discharge and the patient refused   Blood Alcohol level:  Lab Results  Component Value Date   [facility] <15 07/08/2024   ETH <10 03/14/2024    Metabolic Disorder Labs:  No results found for: HGBA1C, MPG No results found for: PROLACTIN No results found for: CHOL, TRIG, HDL, CHOLHDL, VLDL, LDLCALC  See Psychiatric Specialty Exam and Suicide Risk Assessment completed by Attending Physician prior to discharge.  Discharge destination:  Other:  Rehab  Is patient on multiple antipsychotic therapies at discharge:  No    Has Patient had three or more failed trials of antipsychotic monotherapy by history:  No  Recommended Plan for Multiple Antipsychotic Therapies: NA   Allergies as of 07/16/2024   No Known Allergies      Medication List     STOP taking these medications    melatonin 3 MG Tabs tablet   nicotine  21 mg/24hr patch Commonly known as: NICODERM CQ  - dosed in mg/24 hours       TAKE these medications      Indication  busPIRone  10 MG tablet Commonly known as: BUSPAR  Take 1 tablet (10 mg total) by mouth 2 (two) times daily.  Indication: Anxiety Disorder   escitalopram  10 MG tablet Commonly known as: LEXAPRO  Take 1 tablet (10 mg total) by mouth daily. What changed: how much to take  Indication: Generalized Anxiety Disorder, Major Depressive Disorder   OLANZapine  10 MG tablet Commonly known as: ZYPREXA  Take 1 tablet (10 mg total) by mouth at bedtime. What changed:  medication strength how much to take how to take this when to take this additional instructions  Indication: Schizophrenia        Follow-up Information     [facility] Follow [facility].   Why: You have been accepted to [facility] on Monday 07/16/2024 at 10AM. Contact information: [address], [place] [address]  Physical Address    [address] [place], [place] [address]  Phone:  [phone] Fax:      [phone]                Follow-up recommendations:  Activity:  As tolerated    Signed: Keyra Virella, MD 07/16/2024, 10:05 PM
